# Patient Record
Sex: Female | Born: 1995 | Race: Black or African American | Hispanic: No | Marital: Single | Smoking: Never smoker
Health system: Southern US, Community
[De-identification: ages and names within clinical notes are randomized; demographics above are authoritative.]

---
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
## 2013-03-20 ENCOUNTER — Emergency Department (HOSPITAL_COMMUNITY)
Admission: EM | Admit: 2013-03-20 | Discharge: 2013-03-20 | Disposition: A | Discharge status: Home / Self Care | Payer: BC Managed Care – PPO | Attending: Emergency Medicine | Admitting: Emergency Medicine

## 2013-03-20 ENCOUNTER — Encounter (HOSPITAL_COMMUNITY): Payer: Self-pay | Admitting: Emergency Medicine

## 2013-03-20 DIAGNOSIS — S60949A Unspecified superficial injury of unspecified finger, initial encounter: Secondary | ICD-10-CM | POA: Insufficient documentation

## 2013-03-20 DIAGNOSIS — Y9389 Activity, other specified: Secondary | ICD-10-CM | POA: Insufficient documentation

## 2013-03-20 DIAGNOSIS — R609 Edema, unspecified: Secondary | ICD-10-CM | POA: Insufficient documentation

## 2013-03-20 DIAGNOSIS — W4909XA Other specified item causing external constriction, initial encounter: Secondary | ICD-10-CM | POA: Insufficient documentation

## 2013-03-20 DIAGNOSIS — Y929 Unspecified place or not applicable: Secondary | ICD-10-CM | POA: Insufficient documentation

## 2013-03-20 DIAGNOSIS — S60449A External constriction of unspecified finger, initial encounter: Secondary | ICD-10-CM

## 2013-03-20 DIAGNOSIS — W4904XA Ring or other jewelry causing external constriction, initial encounter: Secondary | ICD-10-CM

## 2013-03-20 NOTE — ED Notes (Signed)
PT ambulated with baseline gait; VSS; A&Ox3; no signs of distress; respirations even and unlabored; skin warm and dry; no questions upon discharge.  

## 2013-03-20 NOTE — ED Notes (Signed)
RN at bedside with PA assisting with ring removal. 2 rings were cut with pt's permission. Finger intact and warm with distal pulses.

## 2013-03-20 NOTE — ED Notes (Signed)
Pt hand placed in bucket of ice water.

## 2013-03-20 NOTE — ED Notes (Signed)
Pt reports that she placed a ring in her left middle finger last night and has been unable to get it off. Knuckle is swollen above the ring.

## 2013-03-20 NOTE — ED Provider Notes (Signed)
CSN: [id number]     Arrival date & time 03/20/13  1749 History  This chart was scribed for non-physician practitioner Junius FinnerErin O'Malley, PA-C working with Laray AngerKathleen M McManus, DO by Donne Anonayla Curran, ED Scribe. This patient was seen in room TR06C/TR06C and the patient's care was started at 1838.    First MD Initiated Contact with Patient 03/20/13 1838     Chief Complaint  Patient presents with  . ring stuck on middle finger     The history is provided by the patient. No language interpreter was used.   HPI Comments: Kelly Tate is a 18 y.o. female who presents to the Emergency Department complaining of a ring stuck on her left middle finger. She states she put 3 rings on her finger last night and has been unable to remove two of the three rings since. She reports associated joint swelling around the knuckle. She tried looping floss through the rings to remove them but was unsuccessful. She denies any other pain at this time. She denies any allergies to any medication.   History reviewed. No pertinent past medical history. History reviewed. No pertinent past surgical history. History reviewed. No pertinent family history. History  Substance Use Topics  . Smoking status: Never Smoker   . Smokeless tobacco: Not on file  . Alcohol Use: No   OB History   Grav Para Term Preterm Abortions TAB SAB Ect Mult Living                 Review of Systems  Musculoskeletal: Positive for joint swelling.  All other systems reviewed and are negative.    Allergies  Review of patient's allergies indicates no known allergies.  Home Medications  No current outpatient prescriptions on file.  BP 106/72  Pulse 87  Temp(Src) 97.7 F (36.5 C) (Oral)  Resp 19  SpO2 96%  Physical Exam  Nursing note and vitals reviewed. Constitutional: She is oriented to person, place, and time. She appears well-developed and well-nourished.  HENT:  Head: Normocephalic and atraumatic.  Eyes: EOM are normal.  Neck:  Normal range of motion.  Cardiovascular: Normal rate.   Capillary refill < 3 seconds  Pulmonary/Chest: Effort normal.  Musculoskeletal: Normal range of motion.  Mild edema of left middle finger, erythema. FROM left middle finger. 2 rings metal rings in place. Skin in tact.   Neurological: She is alert and oriented to person, place, and time.  Skin: Skin is warm and dry.  Psychiatric: She has a normal mood and affect. Her behavior is normal.    ED Course  Procedures (including critical care time) DIAGNOSTIC STUDIES: Oxygen Saturation is 99% on RA, normal by my interpretation.    COORDINATION OF CARE: 6:43 PM Discussed treatment plan which includes ring removal with pt at bedside and pt agreed to plan.   6:50 PM Two rings in place on proximal phalanx of left middle finger. Mild edema of PIP.  Ring remover used to remove rings. No immediate complications. Pt tolerated procedure w/o difficulty.     Labs Review Labs Reviewed - No data to display Imaging Review No results found.  EKG Interpretation   None       MDM   1. Constrictive jewelry of finger     I personally performed the services described in this documentation, which was scribed in my presence. The recorded information has been reviewed and is accurate.    Junius Finnerrin O'Malley, PA-C 03/20/13 2307

## 2013-03-21 NOTE — ED Provider Notes (Signed)
Medical screening examination/treatment/procedure(s) were performed by non-physician practitioner and as supervising physician I was immediately available for consultation/collaboration.  EKG Interpretation   None         Prashant Glosser M Halston Fairclough, DO 03/21/13 1031 

## 2015-02-15 DIAGNOSIS — N83201 Unspecified ovarian cyst, right side: Secondary | ICD-10-CM | POA: Insufficient documentation

## 2015-02-21 ENCOUNTER — Other Ambulatory Visit: Payer: Self-pay | Admitting: Obstetrics and Gynecology

## 2015-02-22 ENCOUNTER — Other Ambulatory Visit (HOSPITAL_COMMUNITY): Payer: Self-pay | Admitting: Obstetrics and Gynecology

## 2015-02-22 ENCOUNTER — Encounter (HOSPITAL_COMMUNITY): Payer: Self-pay | Admitting: *Deleted

## 2015-02-22 NOTE — H&P (Signed)
Kelly Tate is a 19 y.o. female G0  presents for laparoscopic ovarian cystectomy because of increasing pelvic pain and enlarging right ovarian cyst. In October 2016 the patient presented with right sided pelvic pain of one month duration.  It was described as constant and crampy, made worse by standing,  intercourse in certain positions and when she's become hungry.  She found relief with Naproxen though a pelvic ultrasound  (01/05/15) showed:  uterus: 7.13 x 3.58 x 4.62 cm, endometrium: 1.10 cm;  left ovary: 4.75 x 2.12 x 3.11 cm with a small complex ? corpus luteum cyst 1.6 x 1.6 x 1.3 cm;  right ovary: 7.92 x 8.46 x 5.81 cm and a complex ? hemorrhagic cyst 5.7 x 7.2 x 7.6 cm with normal venous-arterial waveforms;  free fluid was noted in both adnexae and posterior cul-de-sac. As time went on the patient managed with Naproxen for pain and did not have any bowel  or bladder impairment.  Eventually, however,  she developed urinary frequency, problems starting her urine flow and increased pain that  was not as well controlled with Naproxen.  A repeat ultrasound, 02/15/15 showed a resolved left ovarian cyst but an enlarged right cyst measuring: 8.1 x 6.4 x 7.3 cm.  Given her increasing symptoms and the size of the right ovarian cyst,  the patient has decided to proceed with removal of the ovarian cyst.   Past Medical History  OB History:G: 0  GYN History: menarche: 19 YO    LMP: 02/09/2015    Contracepton: Condoms;   No history of STDs or abnormal PAP smear;  Last PAP smear: 12/2014 showed ASCUS with negative HPV  Medical History: Negative  Surgical History: Negative Denies history of blood transfusions  Family History: Oral  Cancer, Heart Disease and Hypertension  Social History: Single and employed at [employer];  Denies tobacco use and occasionally uses alcohol   Medications:  Naproxen 550 mg bid pc prn Hydrocortisone Cream 1%  Topically prn  No Known Allergies   Denies  sensitivity to peanuts, shellfish, soy, latex or adhesives.   ROS: Admits to pelvic pain, urinary frequency/hesitancy but   denies corrective lenses, headache, vision changes, nasal congestion, dysphagia, tinnitus, dizziness, hoarseness, cough,  chest pain, shortness of breath, nausea, vomiting, diarrhea,constipation,  urinary urgency  dysuria, hematuria, vaginitis symptoms, swelling of joints,easy bruising,  myalgias, arthralgias, skin rashes, unexplained weight loss and except as is mentioned in the history of present illness, patient's review of systems is otherwise negative.   Physical Exam  Bp: 90/60     P: 80 bpm   R: 20   Temperature: 98 degrees F orally  Weight: 135lbs.  Height: 5'9"  BMI: 19.9  Neck: supple without masses or thyromegaly Lungs: clear to auscultation Heart: regular rate and rhythm Abdomen: soft, tenderness in both lower quadrants with voluntary guarding but no rebound and no organomegaly Pelvic:EGBUS- wnl; vagina-normal rugae; uterus-normal size, cervix without lesions or motion tenderness; adnexae-bilateral tenderness or masses Extremities:  no clubbing, cyanosis or edema   Assesment: Large Complex Right Ovarian Cyst           Pelvic Pain   Disposition:  Reviewed the risks of surgery to include, but not limited to: reaction to anesthesia, damage to adjacent organs, infection, excessive bleeding, removal of ovarian tissue and possible open abdominal incision. The patient verbalized understanding of these risks and has consented to [facility] March 01, 2015 at 2  p.m.  CSN# [id number]   Maurion Walkowiak J. Lowell Guitar, PA-C  for Dr. Pierre Bali. Dillard

## 2015-03-01 ENCOUNTER — Ambulatory Visit (HOSPITAL_COMMUNITY): Payer: BC Managed Care – PPO | Admitting: Anesthesiology

## 2015-03-01 ENCOUNTER — Ambulatory Visit (HOSPITAL_COMMUNITY)
Admission: RE | Admit: 2015-03-01 | Discharge: 2015-03-01 | Disposition: A | Discharge status: Home / Self Care | Payer: BC Managed Care – PPO | Source: Ambulatory Visit | Attending: Obstetrics and Gynecology | Admitting: Obstetrics and Gynecology

## 2015-03-01 ENCOUNTER — Encounter (HOSPITAL_COMMUNITY): Payer: Self-pay

## 2015-03-01 ENCOUNTER — Encounter (HOSPITAL_COMMUNITY)
Admission: RE | Disposition: A | Discharge status: Home / Self Care | Payer: Self-pay | Source: Ambulatory Visit | Attending: Obstetrics and Gynecology

## 2015-03-01 DIAGNOSIS — N80129 Deep endometriosis of ovary, unspecified ovary: Secondary | ICD-10-CM

## 2015-03-01 DIAGNOSIS — N801 Endometriosis of ovary: Secondary | ICD-10-CM

## 2015-03-01 DIAGNOSIS — N83201 Unspecified ovarian cyst, right side: Secondary | ICD-10-CM | POA: Insufficient documentation

## 2015-03-01 DIAGNOSIS — M25559 Pain in unspecified hip: Secondary | ICD-10-CM

## 2015-03-01 DIAGNOSIS — R102 Pelvic and perineal pain: Secondary | ICD-10-CM | POA: Insufficient documentation

## 2015-03-01 HISTORY — PX: LAPAROSCOPIC OVARIAN CYSTECTOMY: SHX6248

## 2015-03-01 LAB — CBC
HCT: 36.7 % (ref 36.0–46.0)
Hemoglobin: 11.8 g/dL — ABNORMAL LOW (ref 12.0–15.0)
MCH: 27.6 pg (ref 26.0–34.0)
MCHC: 32.2 g/dL (ref 30.0–36.0)
MCV: 85.7 fL (ref 78.0–100.0)
PLATELETS: 272 10*3/uL (ref 150–400)
RBC: 4.28 MIL/uL (ref 3.87–5.11)
RDW: 14.3 % (ref 11.5–15.5)
WBC: 7.6 10*3/uL (ref 4.0–10.5)

## 2015-03-01 LAB — PREGNANCY, URINE: PREG TEST UR: NEGATIVE

## 2015-03-01 SURGERY — EXCISION, CYST, OVARY, LAPAROSCOPIC
Anesthesia: General | Laterality: Right

## 2015-03-01 MED ORDER — PROPOFOL 10 MG/ML IV BOLUS
INTRAVENOUS | Status: DC | PRN
  Administered 2015-03-01: 160 mg via INTRAVENOUS

## 2015-03-01 MED ORDER — LACTATED RINGERS IV SOLN
INTRAVENOUS | Status: DC
  Administered 2015-03-01 (×2): via INTRAVENOUS

## 2015-03-01 MED ORDER — GLYCOPYRROLATE 0.2 MG/ML IJ SOLN
INTRAMUSCULAR | Status: DC | PRN
  Administered 2015-03-01: .5 mg via INTRAVENOUS
  Administered 2015-03-01: 0.1 mg via INTRAVENOUS

## 2015-03-01 MED ORDER — GLYCOPYRROLATE 0.2 MG/ML IJ SOLN
INTRAMUSCULAR | Status: AC
  Filled 2015-03-01: qty 1

## 2015-03-01 MED ORDER — ACETAMINOPHEN 160 MG/5ML PO SOLN: 325.0000 mg | ORAL | Status: DC | PRN

## 2015-03-01 MED ORDER — NEOSTIGMINE METHYLSULFATE 10 MG/10ML IV SOLN
INTRAVENOUS | Status: AC
  Filled 2015-03-01: qty 1

## 2015-03-01 MED ORDER — SCOPOLAMINE 1 MG/3DAYS TD PT72
MEDICATED_PATCH | TRANSDERMAL | Status: AC
  Administered 2015-03-01: 1.5 mg via TRANSDERMAL
  Filled 2015-03-01: qty 1

## 2015-03-01 MED ORDER — LIDOCAINE HCL (CARDIAC) 20 MG/ML IV SOLN
INTRAVENOUS | Status: DC | PRN
  Administered 2015-03-01: 80 mg via INTRAVENOUS

## 2015-03-01 MED ORDER — ONDANSETRON HCL 4 MG/2ML IJ SOLN
INTRAMUSCULAR | Status: AC
  Filled 2015-03-01: qty 2

## 2015-03-01 MED ORDER — FENTANYL CITRATE (PF) 100 MCG/2ML IJ SOLN
INTRAMUSCULAR | Status: DC | PRN
  Administered 2015-03-01 (×3): 50 ug via INTRAVENOUS
  Administered 2015-03-01: 100 ug via INTRAVENOUS

## 2015-03-01 MED ORDER — MEPERIDINE HCL 25 MG/ML IJ SOLN: 6.2500 mg | INTRAMUSCULAR | Status: DC | PRN

## 2015-03-01 MED ORDER — FENTANYL CITRATE (PF) 100 MCG/2ML IJ SOLN
INTRAMUSCULAR | Status: AC
  Filled 2015-03-01: qty 2

## 2015-03-01 MED ORDER — HYDROCODONE-ACETAMINOPHEN 5-325 MG PO TABS: ORAL_TABLET | ORAL | Status: DC

## 2015-03-01 MED ORDER — LIDOCAINE HCL (CARDIAC) 20 MG/ML IV SOLN
INTRAVENOUS | Status: AC
  Filled 2015-03-01: qty 5

## 2015-03-01 MED ORDER — SCOPOLAMINE 1 MG/3DAYS TD PT72
1.0000 | MEDICATED_PATCH | Freq: Once | TRANSDERMAL | Status: DC
  Administered 2015-03-01: 1.5 mg via TRANSDERMAL

## 2015-03-01 MED ORDER — BUPIVACAINE-EPINEPHRINE (PF) 0.25% -1:200000 IJ SOLN
INTRAMUSCULAR | Status: AC
  Filled 2015-03-01: qty 30

## 2015-03-01 MED ORDER — FENTANYL CITRATE (PF) 100 MCG/2ML IJ SOLN
25.0000 ug | INTRAMUSCULAR | Status: DC | PRN
  Administered 2015-03-01 (×2): 50 ug via INTRAVENOUS

## 2015-03-01 MED ORDER — ACETAMINOPHEN 325 MG PO TABS: 325.0000 mg | ORAL_TABLET | ORAL | Status: DC | PRN

## 2015-03-01 MED ORDER — MIDAZOLAM HCL 2 MG/2ML IJ SOLN
INTRAMUSCULAR | Status: DC | PRN
  Administered 2015-03-01: 2 mg via INTRAVENOUS

## 2015-03-01 MED ORDER — DEXAMETHASONE SODIUM PHOSPHATE 10 MG/ML IJ SOLN
INTRAMUSCULAR | Status: DC | PRN
  Administered 2015-03-01: 4 mg via INTRAVENOUS

## 2015-03-01 MED ORDER — ONDANSETRON HCL 4 MG/2ML IJ SOLN: 4.0000 mg | Freq: Once | INTRAMUSCULAR | Status: DC | PRN

## 2015-03-01 MED ORDER — ONDANSETRON HCL 4 MG/2ML IJ SOLN
INTRAMUSCULAR | Status: DC | PRN
  Administered 2015-03-01: 4 mg via INTRAVENOUS

## 2015-03-01 MED ORDER — OXYCODONE HCL 5 MG PO TABS
5.0000 mg | ORAL_TABLET | Freq: Once | ORAL | Status: DC | PRN
Start: 2015-03-01 — End: 2015-03-01

## 2015-03-01 MED ORDER — FENTANYL CITRATE (PF) 250 MCG/5ML IJ SOLN
INTRAMUSCULAR | Status: AC
  Filled 2015-03-01: qty 5

## 2015-03-01 MED ORDER — LACTATED RINGERS IR SOLN
Status: DC | PRN
  Administered 2015-03-01: 3000 mL

## 2015-03-01 MED ORDER — ROCURONIUM BROMIDE 100 MG/10ML IV SOLN
INTRAVENOUS | Status: AC
  Filled 2015-03-01: qty 1

## 2015-03-01 MED ORDER — NAPROXEN SODIUM 550 MG PO TABS: ORAL_TABLET | ORAL | Status: DC

## 2015-03-01 MED ORDER — KETOROLAC TROMETHAMINE 30 MG/ML IJ SOLN: 30.0000 mg | Freq: Once | INTRAMUSCULAR | Status: DC | PRN

## 2015-03-01 MED ORDER — PROPOFOL 10 MG/ML IV BOLUS
INTRAVENOUS | Status: AC
  Filled 2015-03-01: qty 20

## 2015-03-01 MED ORDER — DEXAMETHASONE SODIUM PHOSPHATE 4 MG/ML IJ SOLN
INTRAMUSCULAR | Status: AC
  Filled 2015-03-01: qty 1

## 2015-03-01 MED ORDER — ROCURONIUM BROMIDE 100 MG/10ML IV SOLN
INTRAVENOUS | Status: DC | PRN
  Administered 2015-03-01: 5 mg via INTRAVENOUS
  Administered 2015-03-01: 10 mg via INTRAVENOUS
  Administered 2015-03-01: 40 mg via INTRAVENOUS

## 2015-03-01 MED ORDER — GLYCOPYRROLATE 0.2 MG/ML IJ SOLN
INTRAMUSCULAR | Status: AC
  Filled 2015-03-01: qty 2

## 2015-03-01 MED ORDER — KETOROLAC TROMETHAMINE 30 MG/ML IJ SOLN
INTRAMUSCULAR | Status: AC
  Filled 2015-03-01: qty 1

## 2015-03-01 MED ORDER — OXYCODONE HCL 5 MG/5ML PO SOLN: 5.0000 mg | Freq: Once | ORAL | Status: DC | PRN

## 2015-03-01 MED ORDER — MIDAZOLAM HCL 2 MG/2ML IJ SOLN
INTRAMUSCULAR | Status: AC
  Filled 2015-03-01: qty 2

## 2015-03-01 MED ORDER — KETOROLAC TROMETHAMINE 30 MG/ML IJ SOLN
INTRAMUSCULAR | Status: DC | PRN
  Administered 2015-03-01: 30 mg via INTRAVENOUS

## 2015-03-01 MED ORDER — NEOSTIGMINE METHYLSULFATE 10 MG/10ML IV SOLN
INTRAVENOUS | Status: DC | PRN
  Administered 2015-03-01: 3 mg via INTRAVENOUS

## 2015-03-01 MED ORDER — BUPIVACAINE-EPINEPHRINE 0.25% -1:200000 IJ SOLN
INTRAMUSCULAR | Status: DC | PRN
  Administered 2015-03-01: 11 mL

## 2015-03-01 SURGICAL SUPPLY — 34 items
BARRIER ADHS 3X4 INTERCEED (GAUZE/BANDAGES/DRESSINGS) IMPLANT
CABLE HIGH FREQUENCY MONO STRZ (ELECTRODE) IMPLANT
CHLORAPREP W/TINT 26ML (MISCELLANEOUS) ×3 IMPLANT
CLOTH BEACON ORANGE TIMEOUT ST (SAFETY) ×3 IMPLANT
DRSG COVADERM PLUS 2X2 (GAUZE/BANDAGES/DRESSINGS) ×6 IMPLANT
DRSG OPSITE POSTOP 3X4 (GAUZE/BANDAGES/DRESSINGS) ×3 IMPLANT
DRSG TELFA 3X8 NADH (GAUZE/BANDAGES/DRESSINGS) ×3 IMPLANT
DRSG VASELINE 3X18 (GAUZE/BANDAGES/DRESSINGS) IMPLANT
FORCEPS CUTTING 33CM 5MM (CUTTING FORCEPS) ×3 IMPLANT
FORCEPS CUTTING 45CM 5MM (CUTTING FORCEPS) IMPLANT
GLOVE BIO SURGEON STRL SZ 6.5 (GLOVE) ×2 IMPLANT
GLOVE BIO SURGEONS STRL SZ 6.5 (GLOVE) ×1
GLOVE BIOGEL PI IND STRL 7.0 (GLOVE) ×3 IMPLANT
GLOVE BIOGEL PI INDICATOR 7.0 (GLOVE) ×6
GOWN STRL REUS W/TWL LRG LVL3 (GOWN DISPOSABLE) ×6 IMPLANT
LIQUID BAND (GAUZE/BANDAGES/DRESSINGS) ×3 IMPLANT
MANIPULATOR UTERINE 4.5 ZUMI (MISCELLANEOUS) IMPLANT
NS IRRIG 1000ML POUR BTL (IV SOLUTION) ×3 IMPLANT
PACK LAPAROSCOPY BASIN (CUSTOM PROCEDURE TRAY) ×3 IMPLANT
PAD POSITIONING PINK XL (MISCELLANEOUS) ×3 IMPLANT
POUCH SPECIMEN RETRIEVAL 10MM (ENDOMECHANICALS) ×3 IMPLANT
SET IRRIG TUBING LAPAROSCOPIC (IRRIGATION / IRRIGATOR) ×3 IMPLANT
SHEARS HARMONIC ACE PLUS 36CM (ENDOMECHANICALS) IMPLANT
SOLUTION ELECTROLUBE (MISCELLANEOUS) IMPLANT
SPONGE SURGIFOAM ABS GEL 12-7 (HEMOSTASIS) ×3 IMPLANT
SUT MNCRL AB 3-0 PS2 27 (SUTURE) ×3 IMPLANT
SUT VICRYL 0 ENDOLOOP (SUTURE) IMPLANT
SUT VICRYL 0 UR6 27IN ABS (SUTURE) ×6 IMPLANT
TOWEL OR 17X24 6PK STRL BLUE (TOWEL DISPOSABLE) ×6 IMPLANT
TRAY FOLEY CATH SILVER 14FR (SET/KITS/TRAYS/PACK) ×3 IMPLANT
TROCAR BALLN 12MMX100 BLUNT (TROCAR) ×3 IMPLANT
TROCAR XCEL NON-BLD 5MMX100MML (ENDOMECHANICALS) ×6 IMPLANT
WARMER LAPAROSCOPE (MISCELLANEOUS) ×3 IMPLANT
WATER STERILE IRR 1000ML POUR (IV SOLUTION) IMPLANT

## 2015-03-01 NOTE — Anesthesia Procedure Notes (Signed)
Procedure Name: Intubation Date/Time: 03/01/2015 2:10 PM Performed by: Yolonda KidaARVER, Celesta Funderburk L Pre-anesthesia Checklist: Patient identified, Emergency Drugs available, Suction available and Patient being monitored Patient Re-evaluated:Patient Re-evaluated prior to inductionOxygen Delivery Method: Circle system utilized Preoxygenation: Pre-oxygenation with 100% oxygen Intubation Type: IV induction Ventilation: Mask ventilation without difficulty Laryngoscope Size: Mac and 3 Grade View: Grade I Tube type: Oral Tube size: 7.0 mm Number of attempts: 1 Airway Equipment and Method: Stylet Placement Confirmation: ETT inserted through vocal cords under direct vision,  positive ETCO2,  CO2 detector and breath sounds checked- equal and bilateral Secured at: 22 cm Tube secured with: Tape Dental Injury: Teeth and Oropharynx as per pre-operative assessment

## 2015-03-01 NOTE — Discharge Instructions (Addendum)
Call [facility] @ [phone] if:  You have a temperature greater than or equal to 100.4 degrees Farenheit orally You have pain that is not made better by the pain medication given and taken as directed You have excessive bleeding or problems urinating  Take Colace (Docusate Sodium/Stool Softener) 100 mg 2-3 times daily while taking narcotic pain medicine to avoid constipation or until bowel movements are regular. Take Naproxen 550 mg with food twice a day for 5 days then as needed for pain   You may drive after 48 hours You may walk up steps  You may shower tomorrow You may resume a regular diet  Keep incisions clean and dry Do not lift over 15 pounds for 6 weeks Avoid anything in vagina until after your post-operative visit Ovarian Cyst An ovarian cyst is a fluid-filled sac that forms on an ovary. The ovaries are small organs that produce eggs in women. Various types of cysts can form on the ovaries. Most are not cancerous. Many do not cause problems, and they often go away on their own. Some may cause symptoms and require treatment. Common types of ovarian cysts include:  Functional cysts--These cysts may occur every month during the menstrual cycle. This is normal. The cysts usually go away with the next menstrual cycle if the woman does not get pregnant. Usually, there are no symptoms with a functional cyst.  Endometrioma cysts--These cysts form from the tissue that lines the uterus. They are also called "chocolate cysts" because they become filled with blood that turns brown. This type of cyst can cause pain in the lower abdomen during intercourse and with your menstrual period.  Cystadenoma cysts--This type develops from the cells on the outside of the ovary. These cysts can get very big and cause lower abdomen pain and pain with intercourse. This type of cyst can twist on itself, cut off its blood supply, and cause severe pain. It can also easily rupture and cause a  lot of pain.  Dermoid cysts--This type of cyst is sometimes found in both ovaries. These cysts may contain different kinds of body tissue, such as skin, teeth, hair, or cartilage. They usually do not cause symptoms unless they get very big.  Theca lutein cysts--These cysts occur when too much of a certain hormone (human chorionic gonadotropin) is produced and overstimulates the ovaries to produce an egg. This is most common after procedures used to assist with the conception of a baby (in vitro fertilization). CAUSES   Fertility drugs can cause a condition in which multiple large cysts are formed on the ovaries. This is called ovarian hyperstimulation syndrome.  A condition called polycystic ovary syndrome can cause hormonal imbalances that can lead to nonfunctional ovarian cysts. SIGNS AND SYMPTOMS  Many ovarian cysts do not cause symptoms. If symptoms are present, they may include:  Pelvic pain or pressure.  Pain in the lower abdomen.  Pain during sexual intercourse.  Increasing girth (swelling) of the abdomen.  Abnormal menstrual periods.  Increasing pain with menstrual periods.  Stopping having menstrual periods without being pregnant. DIAGNOSIS  These cysts are commonly found during a routine or annual pelvic exam. Tests may be ordered to find out more about the cyst. These tests may include:  Ultrasound.  X-ray of the pelvis.  CT scan.  MRI.  Blood tests. TREATMENT  Many ovarian cysts go away on their own without treatment. Your health care provider may want to check your cyst regularly for 2-3 months to see if  it changes. For women in menopause, it is particularly important to monitor a cyst closely because of the higher rate of ovarian cancer in menopausal women. When treatment is needed, it may include any of the following:  A procedure to drain the cyst (aspiration). This may be done using a long needle and ultrasound. It can also be done through a laparoscopic  procedure. This involves using a thin, lighted tube with a tiny camera on the end (laparoscope) inserted through a small incision.  Surgery to remove the whole cyst. This may be done using laparoscopic surgery or an open surgery involving a larger incision in the lower abdomen.  Hormone treatment or birth control pills. These methods are sometimes used to help dissolve a cyst. HOME CARE INSTRUCTIONS   Only take over-the-counter or prescription medicines as directed by your health care provider.  Follow up with your health care provider as directed.  Get regular pelvic exams and Pap tests. SEEK MEDICAL CARE IF:   Your periods are late, irregular, or painful, or they stop.  Your pelvic pain or abdominal pain does not go away.  Your abdomen becomes larger or swollen.  You have pressure on your bladder or trouble emptying your bladder completely.  You have pain during sexual intercourse.  You have feelings of fullness, pressure, or discomfort in your stomach.  You lose weight for no apparent reason.  You feel generally ill.  You become constipated.  You lose your appetite.  You develop acne.  You have an increase in body and facial hair.  You are gaining weight, without changing your exercise and eating habits.  You think you are pregnant. SEEK IMMEDIATE MEDICAL CARE IF:   You have increasing abdominal pain.  You feel sick to your stomach (nauseous), and you throw up (vomit).  You develop a fever that comes on suddenly.  You have abdominal pain during a bowel movement.  Your menstrual periods become heavier than usual. MAKE SURE YOU:  Understand these instructions.  Will watch your condition.  Will get help right away if you are not doing well or get worse.   This information is not intended to replace advice given to you by your health care provider. Make sure you discuss any questions you have with your health care provider.   Document Released: 02/17/2005  Document Revised: 02/22/2013 Document Reviewed: 10/25/2012 Elsevier Interactive Patient Education 2016 [employer] DISCHARGE INSTRUCTIONS: Laparoscopy  The following instructions have been prepared to help you care for yourself upon your return home today.  Wound care:  Do not get the incision wet for the first 24 hours. The incision should be kept clean and dry.  The Band-Aids or dressings may be removed the day after surgery.  Should the incision become sore, red, and swollen after the first week, check with your doctor.  Personal hygiene:  Shower the day after your procedure.  Activity and limitations:  Do NOT drive or operate any equipment today.  Do NOT lift anything more than 15 pounds for 2-3 weeks after surgery.  Do NOT rest in bed all day.  Walking is encouraged. Walk each day, starting slowly with 5-minute walks 3 or 4 times a day. Slowly increase the length of your walks.  Walk up and down stairs slowly.  Do NOT do strenuous activities, such as golfing, playing tennis, bowling, running, biking, weight lifting, gardening, mowing, or vacuuming for 2-4 weeks. Ask your doctor when it is okay to start.  Diet: Eat a light meal  as desired this evening. You may resume your usual diet tomorrow.  Return to work: This is dependent on the type of work you do. For the most part you can return to a desk job within a week of surgery. If you are more active at work, please discuss this with your doctor.  What to expect after your surgery: You may have a slight burning sensation when you urinate on the first day. You may have a very small amount of blood in the urine. Expect to have a small amount of vaginal discharge/light bleeding for 1-2 weeks. It is not unusual to have abdominal soreness and bruising for up to 2 weeks. You may be tired and need more rest for about 1 week. You may experience shoulder pain for 24-72 hours. Lying flat in bed may relieve it.  Call your doctor for  any of the following:  Develop a fever of 100.4 or greater  Inability to urinate 6 hours after discharge from hospital  Severe pain not relieved by pain medications  Persistent of heavy bleeding at incision site  Redness or swelling around incision site after a week  Increasing nausea or vomiting  Patient Signature________________________________________ Nurse Signature_________________________________________ Post Anesthesia Home Care Instructions  Activity: Get plenty of rest for the remainder of the day. A responsible adult should stay with you for 24 hours following the procedure.  For the next 24 hours, DO NOT: -Drive a car -Advertising copywriter -Drink alcoholic beverages -Take any medication unless instructed by your physician -Make any legal decisions or sign important papers.  Meals: Start with liquid foods such as gelatin or soup. Progress to regular foods as tolerated. Avoid greasy, spicy, heavy foods. If nausea and/or vomiting occur, drink only clear liquids until the nausea and/or vomiting subsides. Call your physician if vomiting continues.  Special Instructions/Symptoms: Your throat may feel dry or sore from the anesthesia or the breathing tube placed in your throat during surgery. If this causes discomfort, gargle with warm salt water. The discomfort should disappear within 24 hours.  If you had a scopolamine patch placed behind your ear for the management of post- operative nausea and/or vomiting:  1. The medication in the patch is effective for 72 hours, after which it should be removed.  Wrap patch in a tissue and discard in the trash. Wash hands thoroughly with soap and water. 2. You may remove the patch earlier than 72 hours if you experience unpleasant side effects which may include dry mouth, dizziness or visual disturbances. 3. Avoid touching the patch. Wash your hands with soap and water after contact with the patch.

## 2015-03-01 NOTE — Op Note (Signed)
Diagnostic Laparoscopy Procedure Note  Indications: The patient is a 19 y.o. female with large right ovarian cyst.  Pre-operative Diagnosis: pelvic pain with right ovarian cyst  Post-operative Diagnosis: same  Surgeon: ZOXWRUE,AVWUJILLARD,Macarthur Lorusso A   Assistants: Henreitta LeberElmira Powell PA  Anesthesia: General endotracheal anesthesia  ASA Class: per anesthesia  Procedure Details  The patient was seen in the Holding Room. The risks, benefits, complications, treatment options, and expected outcomes were discussed with the patient. The possibilities of reaction to medication, pulmonary aspiration, perforation of viscus, bleeding, recurrent infection, the need for additional procedures, failure to diagnose a condition, and creating a complication requiring transfusion or operation were discussed with the patient. The patient concurred with the proposed plan, giving informed consent. The patient was taken to the Operating Room, identified as Psychologist, educationalArnetta Ewart and the procedure verified as Diagnostic Laparoscopy. A Time Out was held and the above information confirmed.  After induction of general anesthesia, the patient was placed in modified dorsal lithotomy position where she was prepped, draped, and catheterized in the normal, sterile fashion.  The cervix was visualized and an intrauterine manipulator was placed. A 2 cm umbilical incision was then performed. The incision was carried down to the fascia. The fascia and peritoneum was entered.  o vicryl was placed around the fascia.  The hassan was placed.  The above findings were noted. Large 12 cm right ovariancyst seen.  It was filled with chocolate colored fluid c/w endometrioma.  Left ovary appeared normal.    T  Using a needle and suction the cyst was punctured.  The fluid was drained.  Using the gyrus and traction with counter traction the cyst wall was removed.  The access ovary was removed.  Hemostasis was assured.  All instruents were removed.  The umbilical  fascia was reapproximated by tying the suture.  The skin incision was closed in a subcuticlar fashion.  Remainder on incisions close with dermabond.    The intrauterine manipulator was then removed.  Instrument, sponge, and needle counts were correct prior to abdominal closure and at the conclusion of the case.   Findings: The anterior cul-de-sac and round ligaments wnl The uterus WNL The adnexa SEE ABOVE Cul-de-sac SOME POWDER BURN LESIONS SEEN   Estimated Blood Loss:  Minimal         Drains: NONE         Total IV Fluids: 1500mL         Specimens: CYST WALL AND PORTION OF OVARY              Complications:  None; patient tolerated the procedure well.         Disposition: PACU - hemodynamically stable.         Condition: stable

## 2015-03-01 NOTE — Anesthesia Preprocedure Evaluation (Signed)
Anesthesia Evaluation  Patient identified by MRN, date of birth, ID band Patient awake    Reviewed: Allergy & Precautions, H&P , NPO status , Patient's Chart, lab work & pertinent test results  Airway Mallampati: I  TM Distance: >3 FB Neck ROM: full    Dental  (+) Teeth Intact   Pulmonary neg pulmonary ROS,    Pulmonary exam normal        Cardiovascular negative cardio ROS Normal cardiovascular exam     Neuro/Psych negative neurological ROS  negative psych ROS   GI/Hepatic negative GI ROS, Neg liver ROS,   Endo/Other  negative endocrine ROS  Renal/GU negative Renal ROS     Musculoskeletal   Abdominal Normal abdominal exam  (+)   Peds  Hematology negative hematology ROS (+)   Anesthesia Other Findings   Reproductive/Obstetrics negative OB ROS                             Anesthesia Physical Anesthesia Plan  ASA: I  Anesthesia Plan: General   Post-op Pain Management:    Induction: Intravenous  Airway Management Planned: Oral ETT  Additional Equipment:   Intra-op Plan:   Post-operative Plan: Extubation in OR  Informed Consent: I have reviewed the patients History and Physical, chart, labs and discussed the procedure including the risks, benefits and alternatives for the proposed anesthesia with the patient or authorized representative who has indicated his/her understanding and acceptance.   Dental Advisory Given  Plan Discussed with: CRNA and Surgeon  Anesthesia Plan Comments:         Anesthesia Quick Evaluation

## 2015-03-01 NOTE — Anesthesia Postprocedure Evaluation (Signed)
Anesthesia Post Note  Patient: Psychologist, [name]  Procedure(s) Performed: Procedure(s) (LRB): LAPAROSCOPIC OVARIAN CYSTECTOMY (Right)  Patient location during evaluation: PACU Anesthesia Type: General Level of consciousness: awake Pain management: pain level controlled Vital Signs Assessment: post-procedure vital signs reviewed and stable Respiratory status: spontaneous breathing Cardiovascular status: stable Postop Assessment: no signs of nausea or vomiting Anesthetic complications: no    Last Vitals:  Filed Vitals:   03/01/15 1630 03/01/15 1645  BP: 112/58 120/88  Pulse: 68 59  Temp: 36.9 C   Resp: 17 14    Last Pain:  Filed Vitals:   03/01/15 1655  PainSc: 6                  Asael Pann JR,JOHN Emmanuela Ghazi

## 2015-03-01 NOTE — Transfer of Care (Signed)
Immediate Anesthesia Transfer of Care Note  Patient: Psychologist, [name]  Procedure(s) Performed: Procedure(s): LAPAROSCOPIC OVARIAN CYSTECTOMY (Right)  Patient Location: PACU  Anesthesia Type:General  Level of Consciousness: awake  Airway & Oxygen Therapy: Patient Spontanous Breathing  Post-op Assessment: Report given to PACU RN  Post vital signs: stable  Filed Vitals:   03/01/15 1219  BP: 109/80  Pulse: 82  Temp: 36.6 C  Resp: 18    Complications: No apparent anesthesia complications

## 2015-03-02 ENCOUNTER — Encounter (HOSPITAL_COMMUNITY): Payer: Self-pay | Admitting: Obstetrics and Gynecology

## 2015-05-17 DIAGNOSIS — N809 Endometriosis, unspecified: Secondary | ICD-10-CM | POA: Insufficient documentation

## 2020-02-02 DIAGNOSIS — N94810 Vulvar vestibulitis: Secondary | ICD-10-CM | POA: Insufficient documentation

## 2020-07-01 ENCOUNTER — Other Ambulatory Visit: Payer: Self-pay

## 2020-07-01 ENCOUNTER — Emergency Department (HOSPITAL_COMMUNITY)
Admission: EM | Admit: 2020-07-01 | Discharge: 2020-07-01 | Disposition: A | Discharge status: Home / Self Care | Payer: 59 | Attending: Emergency Medicine | Admitting: Emergency Medicine

## 2020-07-01 DIAGNOSIS — X088XXA Exposure to other specified smoke, fire and flames, initial encounter: Secondary | ICD-10-CM | POA: Insufficient documentation

## 2020-07-01 DIAGNOSIS — T2601XA Burn of right eyelid and periocular area, initial encounter: Secondary | ICD-10-CM | POA: Diagnosis present

## 2020-07-01 DIAGNOSIS — Y9389 Activity, other specified: Secondary | ICD-10-CM | POA: Diagnosis not present

## 2020-07-01 DIAGNOSIS — T2641XA Burn of right eye and adnexa, part unspecified, initial encounter: Secondary | ICD-10-CM

## 2020-07-01 MED ORDER — ERYTHROMYCIN 5 MG/GM OP OINT: TOPICAL_OINTMENT | OPHTHALMIC | 0 refills | Status: DC

## 2020-07-01 MED ORDER — TETRACAINE HCL 0.5 % OP SOLN
2.0000 [drp] | Freq: Once | OPHTHALMIC | Status: AC
  Administered 2020-07-01: 2 [drp] via OPHTHALMIC
  Filled 2020-07-01: qty 4

## 2020-07-01 MED ORDER — FLUORESCEIN SODIUM 1 MG OP STRP
1.0000 | ORAL_STRIP | Freq: Once | OPHTHALMIC | Status: AC
  Administered 2020-07-01: 1 via OPHTHALMIC
  Filled 2020-07-01: qty 1
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
## 2020-07-01 NOTE — Discharge Instructions (Addendum)
  Apply the erythromycin ointment, as prescribed, 4 times daily for 5 days.  Follow-up with the ophthalmologist in the office.  They expect to see you in the office tomorrow morning at 8:15 AM.
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
## 2020-09-25 DIAGNOSIS — B009 Herpesviral infection, unspecified: Secondary | ICD-10-CM | POA: Insufficient documentation

## 2020-09-25 DIAGNOSIS — M8580 Other specified disorders of bone density and structure, unspecified site: Secondary | ICD-10-CM | POA: Insufficient documentation

## 2021-04-25 ENCOUNTER — Ambulatory Visit
Admission: EM | Admit: 2021-04-25 | Discharge: 2021-04-25 | Disposition: A | Discharge status: Home / Self Care | Payer: 59

## 2021-04-25 ENCOUNTER — Encounter (HOSPITAL_COMMUNITY): Payer: Self-pay | Admitting: Emergency Medicine

## 2021-04-25 ENCOUNTER — Other Ambulatory Visit: Payer: Self-pay

## 2021-04-25 ENCOUNTER — Ambulatory Visit (HOSPITAL_COMMUNITY)
Admission: EM | Admit: 2021-04-25 | Discharge: 2021-04-25 | Disposition: A | Discharge status: Home / Self Care | Payer: 59

## 2021-04-25 ENCOUNTER — Ambulatory Visit: Payer: Self-pay

## 2021-04-25 ENCOUNTER — Ambulatory Visit (INDEPENDENT_AMBULATORY_CARE_PROVIDER_SITE_OTHER): Payer: 59

## 2021-04-25 DIAGNOSIS — M25512 Pain in left shoulder: Secondary | ICD-10-CM

## 2021-04-25 DIAGNOSIS — M25532 Pain in left wrist: Secondary | ICD-10-CM

## 2021-04-25 NOTE — ED Triage Notes (Addendum)
Seen for scapula and left wrist pain and prescribed medicines, but no relief.  Patient saw pcp, thought related to job, so pcp sent patient to workers comp.  Workers comp said patient needs evaluation . Patient has pain left radial aspect of left wrist and left shoulder blade .  Patient is right handed.  Left radial pulse is 2 +

## 2021-04-25 NOTE — ED Triage Notes (Signed)
Pt presents with c/o bilateral hand, arm and shoulder pain. States she thinks the job she does is causing the pain.

## 2021-04-25 NOTE — ED Notes (Signed)
Attempt to call patient, no response. 

## 2021-04-25 NOTE — Telephone Encounter (Signed)
°  Chief Complaint: wrist injury Symptoms: L wrist pain and swelling, unable to grip Frequency: since 03/27/21 Pertinent Negatives: NA Disposition: [] ED /[x] Urgent Care (no appt availability in office) / [] Appointment(In office/virtual)/ []  [facility]/ [] Home Care/ [] Refused Recommended Disposition /[] [facility]/ []  Follow-up with PCP Additional Notes: Pt has no PCP, advised her to go to UC and gave different [facility] Locations for UC. Pt states she will go to UC today to be seen and get second opinion.    Summary: wrist pain and advise   Follow up with pt that injured her wrist at work / pt was seen at [facility] and an office and her employer wouldn't accept the OV notes due to no diagnoses / pt is seeking advice / she is scheduled for a NP appt with [facility] next week / I advised of the ortho office number as well from the portal / Pt works at the [employer] and her wrist and hand gets irritated when being repetitive / please advise      Reason for Disposition  [1] SEVERE pain AND [2] not improved 2 hours after pain medicine/ice packs  Answer Assessment - Initial Assessment Questions 1. MECHANISM: "How did the injury happen?"     Wrist and thumb had sharp pain 2. ONSET: "When did the injury happen?" (Minutes or hours ago)      03/27/21 3. LOCATION: "Where is the injury located?" "Which arm?"     L wrist and thumb 4. APPEARANCE of INJURY: "What does the injury look like?"      Swelling has improved 5. SEVERITY: "Can you use the arm normally?"      Limited activities, unable to fully grip 6. SWELLING or BRUISING: "is there any swelling or bruising?" If Yes, ask: "How large is it? (e.g., inches, centimeters)      slightly 7. PAIN: "Is there pain?" If Yes, ask: "How bad is the pain?"    (Scale 1-10; or mild, moderate, severe)   - NONE (0): no pain.   - MILD (1-3): doesn't interfere with normal activities   - MODERATE (4-7): interferes with normal activities (e.g., work  or school) or awakens from sleep   - SEVERE (8-10): excruciating pain, unable to do any normal activities, unable to hold a cup of water     10 8. TETANUS: For any breaks in the skin, ask: "When was the last tetanus booster?"     NA 9. OTHER SYMPTOMS: "Do you have any other symptoms?"  (e.g., numbness in hand)     No  Protocols used: Arm Injury-A-AH

## 2021-04-25 NOTE — ED Provider Notes (Signed)
[facility]    CSN: [id number] Arrival date & time: 04/25/21  1718    HISTORY   Chief Complaint  Patient presents with   Arm Pain   HPI Kelly Tate is a 26 y.o. female. Pt presents with c/o bilateral hand, arm and shoulder pain. States she thinks the job she does is causing the pain.  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  Patient states that she has noticed that when she is holding her cell phone or doing the dishes, she has numbness and tingling in her thumbs and first few fingers on her left hand, states she started to get the same symptoms on the right.  Patient states she is right-handed.  Patient states he is also noticed that she has numb sensation on the back of her left shoulder, demonstrates by using her right hand to touch this area saying, "I know I am touching it but I can't feel that".    Patient was seen by her primary care provider on February 9 with the same concern.  Per encounter note, her PCP offered physical therapy which patient declined.  PCP recommended she continue NSAIDs and Tylenol as needed.  Also per PCP note, patient advised PCP that she has also been urgent care for the same issue as well, advised PCP that she was provided with a tapering dose of steroids which was also ineffective.  EMR reviewed, I do not see that any imaging has been performed as of yet.  Patient endorses visit to urgent care, states she was given muscle relaxer and tapering dose of steroids and advises me that this was ineffective.  Patient states that her primary care actually referred her to [facility].  I do not see any documentation of this in the PCP note.   The history is provided by the patient.  History reviewed. No pertinent past medical history. There are no problems to display for this patient.  Past Surgical History:  Procedure Laterality Date   LAPAROSCOPIC OVARIAN CYSTECTOMY  Right 03/01/2015   Procedure: LAPAROSCOPIC OVARIAN CYSTECTOMY;  Surgeon: Jaymes Graff, MD;  Location: [facility];  Service: Gynecology;  Laterality: Right;   OB History   No obstetric history on file.    Home Medications    Prior to Admission medications   Not on File    Family History No family history on file. Social History Social History   Tobacco Use   Smoking status: Never  Vaping Use   Vaping Use: Never used  Substance Use Topics   Alcohol use: No   Drug use: Not Currently    Types: Marijuana    Comment: social   Allergies   Patient has no known allergies.  Review of Systems Review of Systems Pertinent findings noted in history of present illness.   Physical Exam Triage Vital Signs ED Triage Vitals  Enc Vitals Group     BP 12/28/20 0827 (!) 147/82     Pulse Rate 12/28/20 0827 72     Resp 12/28/20 0827 18     Temp 12/28/20 0827 98.3 F (36.8 C)     Temp Source 12/28/20 0827 Oral     SpO2 12/28/20 0827 98 %     Weight --      Height --      Head Circumference --      Peak Flow --  Pain Score 12/28/20 0826 5     Pain Loc --      Pain Edu? --      Excl. in GC? --   No data found.  Updated Vital Signs BP 110/74 (BP Location: Right Arm)    Pulse 82    Temp 98.2 F (36.8 C) (Oral)    Resp 18    SpO2 98%   Physical Exam Vitals and nursing note reviewed.  Constitutional:      General: She is not in acute distress.    Appearance: Normal appearance. She is not ill-appearing.  HENT:     Head: Normocephalic and atraumatic.  Eyes:     General: Lids are normal.        Right eye: No discharge.        Left eye: No discharge.     Extraocular Movements: Extraocular movements intact.     Conjunctiva/sclera: Conjunctivae normal.     Right eye: Right conjunctiva is not injected.     Left eye: Left conjunctiva is not injected.  Neck:     Trachea: Trachea and phonation normal.  Cardiovascular:     Rate and Rhythm: Normal rate and regular rhythm.      Pulses: Normal pulses.     Heart sounds: Normal heart sounds. No murmur heard.   No friction rub. No gallop.  Pulmonary:     Effort: Pulmonary effort is normal. No accessory muscle usage, prolonged expiration or respiratory distress.     Breath sounds: Normal breath sounds. No stridor, decreased air movement or transmitted upper airway sounds. No decreased breath sounds, wheezing, rhonchi or rales.  Chest:     Chest wall: No tenderness.  Musculoskeletal:        General: Normal range of motion.     Right shoulder: No swelling, deformity, tenderness, bony tenderness or crepitus. Normal range of motion. Normal strength. Normal pulse.     Left shoulder: Normal. No swelling, deformity, tenderness, bony tenderness or crepitus. Normal range of motion. Normal strength. Normal pulse.     Right wrist: Normal. No swelling, deformity, effusion, tenderness, bony tenderness, snuff box tenderness or crepitus. Normal range of motion.     Left wrist: Tenderness (Positive Lourena SimmondsFinkelstein) present. No swelling, deformity, effusion, bony tenderness, snuff box tenderness or crepitus. Normal range of motion.     Right hand: Normal.     Left hand: Normal.     Cervical back: Normal range of motion and neck supple. Normal range of motion.  Lymphadenopathy:     Cervical: No cervical adenopathy.  Skin:    General: Skin is warm and dry.     Findings: No erythema or rash.  Neurological:     General: No focal deficit present.     Mental Status: She is alert and oriented to person, place, and time.  Psychiatric:        Mood and Affect: Mood normal.        Behavior: Behavior normal.    Visual Acuity Right Eye Distance:   Left Eye Distance:   Bilateral Distance:    Right Eye Near:   Left Eye Near:    Bilateral Near:     UC Couse / Diagnostics / Procedures:    EKG  Radiology DG Wrist Complete Left  Result Date: 04/25/2021 CLINICAL DATA:  Two months of radial wrist pain. EXAM: LEFT WRIST - COMPLETE 3+ VIEW  COMPARISON:  None. FINDINGS: There is no evidence of fracture or dislocation. There is no evidence of  arthropathy or other focal bone abnormality. Soft tissues are unremarkable. IMPRESSION: Negative. Electronically Signed   By: Maudry Mayhew M.D.   On: 04/25/2021 18:58   DG Shoulder Left  Result Date: 04/25/2021 CLINICAL DATA:  Six months of left shoulder pain and numbness. No known injury. EXAM: LEFT SHOULDER - 2+ VIEW COMPARISON:  None. FINDINGS: There is no evidence of fracture or dislocation. There is no evidence of arthropathy or other focal bone abnormality. Soft tissues are unremarkable. Visualized lung fields are clear. IMPRESSION: Negative. Electronically Signed   By: Maudry Mayhew M.D.   On: 04/25/2021 18:56    Procedures Procedures (including critical care time)  UC Diagnoses / Final Clinical Impressions(s)   I have reviewed the triage vital signs and the nursing notes.  Pertinent labs & imaging results that were available during my care of the patient were reviewed by me and considered in my medical decision making (see chart for details).    Final diagnoses:  Left wrist pain  Acute pain of left shoulder   X-ray of left wrist and right shoulder are unremarkable.  Patient advised.  Patient advised to follow-up with primary care for referral to orthopedics.  ED Prescriptions   None    PDMP not reviewed this encounter.  Pending results:  Labs Reviewed - No data to display  Medications Ordered in UC: Medications - No data to display  Disposition Upon Discharge:  Condition: stable for discharge home Home: take medications as prescribed; routine discharge instructions as discussed; follow up as advised.  Patient presented with an acute illness with associated systemic symptoms and significant discomfort requiring urgent management. In my opinion, this is a condition that a prudent lay person (someone who possesses an average knowledge of health and medicine) may  potentially expect to result in complications if not addressed urgently such as respiratory distress, impairment of bodily function or dysfunction of bodily organs.   Routine symptom specific, illness specific and/or disease specific instructions were discussed with the patient and/or caregiver at length.   As such, the patient has been evaluated and assessed, work-up was performed and treatment was provided in alignment with urgent care protocols and evidence based medicine.  Patient/parent/caregiver has been advised that the patient may require follow up for further testing and treatment if the symptoms continue in spite of treatment, as clinically indicated and appropriate.  If the patient was tested for COVID-19, Influenza and/or RSV, then the patient/parent/guardian was advised to isolate at home pending the results of his/her diagnostic coronavirus test and potentially longer if theyre positive. I have also advised pt that if his/her COVID-19 test returns positive, it's recommended to self-isolate for at least 10 days after symptoms first appeared AND until fever-free for 24 hours without fever reducer AND other symptoms have improved or resolved. Discussed self-isolation recommendations as well as instructions for household member/close contacts as per the [facility] and [place] DHHS, and also gave patient the COVID packet with this information.  Patient/parent/caregiver has been advised to return to the [facility] or PCP in 3-5 days if no better; to PCP or the Emergency Department if new signs and symptoms develop, or if the current signs or symptoms continue to change or worsen for further workup, evaluation and treatment as clinically indicated and appropriate  The patient will follow up with their current PCP if and as advised. If the patient does not currently have a PCP we will assist them in obtaining one.   The patient may need specialty follow up  if the symptoms continue, in spite of conservative treatment  and management, for further workup, evaluation, consultation and treatment as clinically indicated and appropriate.   Patient/parent/caregiver verbalized understanding and agreement of plan as discussed.  All questions were addressed during visit.  Please see discharge instructions below for further details of plan.  Discharge Instructions:   Discharge Instructions      Please follow-up with your primary care provider.  Is my opinion that you need to be evaluated by an orthopedic specialist for issues such as carpal tunnel syndrome of your wrists or shoulder impingement syndrome of your left shoulder, we are unable to evaluate your diagnosed you with either of these conditions here in the urgent care setting.  I provided you with a note to be out of work for a few days to see if rest helps relieve your pain.    This office note has been dictated using Teaching laboratory technician.  Unfortunately, and despite my best efforts, this method of dictation can sometimes lead to occasional typographical or grammatical errors.  I apologize in advance if this occurs.     Theadora Rama Scales, [place] 04/25/21 1905

## 2021-04-25 NOTE — Discharge Instructions (Addendum)
Please follow-up with your primary care provider.  Is my opinion that you need to be evaluated by an orthopedic specialist for issues such as carpal tunnel syndrome of your wrists or shoulder impingement syndrome of your left shoulder, we are unable to evaluate your diagnosed you with either of these conditions here in the urgent care setting.  I provided you with a note to be out of work for a few days to see if rest helps relieve your pain.

## 2021-05-01 ENCOUNTER — Encounter: Payer: Self-pay | Admitting: Registered Nurse

## 2021-05-01 ENCOUNTER — Ambulatory Visit (INDEPENDENT_AMBULATORY_CARE_PROVIDER_SITE_OTHER): Payer: 59 | Admitting: Registered Nurse

## 2021-05-01 VITALS — HR 77 | Temp 98.3°F | Resp 16 | Ht 71.0 in | Wt 173.0 lb

## 2021-05-01 DIAGNOSIS — M25432 Effusion, left wrist: Secondary | ICD-10-CM

## 2021-05-01 DIAGNOSIS — M25532 Pain in left wrist: Secondary | ICD-10-CM

## 2021-05-01 MED ORDER — DICLOFENAC SODIUM 1 % EX GEL: 2.0000 g | Freq: Four times a day (QID) | CUTANEOUS | 0 refills | Status: DC

## 2021-05-01 NOTE — Progress Notes (Signed)
? ?New Patient Office Visit ? ?Subjective:  ?Patient ID: Kelly Tate, female    DOB: 06/21/1995  Age: 26 y.o. MRN: [id number] ? ?CC:  ?Chief Complaint  ?Patient presents with  ? New Patient (Initial Visit)  ?  Patient states she is here to establish care .  ? ? ?HPI ?Kelly Tate presents for visit to est care ? ?Her primary concern today is an injury she sustained at work.  ?We discussed in depth that we do not process workman's comp claims through our office and that, should she want to ensure coverage for services, she should receive care from a provider she is referred to by her employer ([employer]). By agreeing to receive care today, she acknowledges that services rendered may not be covered under a workman's comp claim. She voices understanding and agreement to proceed. ? ?She notes chronic pain of wrists, L>R. 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 Still gives pain. Minimal at rest, but when doing any activity with hands, aching. Mostly at radial styloid. Notes it causes sensory disturbance in thumb.  ?She does note some L shoulder pain and superficial numbness on the upper L portion of her back. She states this has been going on about as long as the pain has been.  ?She has tried prednisone, NSAIDs, tylenol, and muscle relaxers without relief.  ? ?She denies limits to ROM, swelling, redness, fam hx of autoimmune processes, nodes on joints, lower extremity joint pains, rash, headaches, visual changes, shob, doe, chest pain, blanching or hyperpigmentation of skin. ? ?Otherwise no acute concerns at this time.  ? ? ?No past medical history on file. ? ?Past Surgical History:  ?Procedure Laterality Date  ? LAPAROSCOPIC OVARIAN CYSTECTOMY Right 03/01/2015  ? Procedure: LAPAROSCOPIC OVARIAN CYSTECTOMY;  Surgeon: Jaymes Graff, MD;  Location: [facility];  Service: Gynecology;  Laterality: Right;  ? ? ?No family history on file. ? ?Social History  ? ?Socioeconomic  History  ? Marital status: Single  ?  Spouse name: Not on file  ? Number of children: Not on file  ? Years of education: Not on file  ? Highest education level: Not on file  ?Occupational History  ? Not on file  ?Tobacco Use  ? Smoking status: Never  ? Smokeless tobacco: Not on file  ?Vaping Use  ? Vaping Use: Never used  ?Substance and Sexual Activity  ? Alcohol use: No  ? Drug use: Not Currently  ?  Types: Marijuana  ?  Comment: social  ? Sexual activity: Yes  ?  Birth control/protection: None  ?Other Topics Concern  ? Not on file  ?Social History Narrative  ? Not on file  ? ?Social Determinants of Health  ? ?Financial Resource Strain: Not on file  ?Food Insecurity: Not on file  ?Transportation Needs: Not on file  ?Physical Activity: Not on file  ?Stress: Not on file  ?Social Connections: Not on file  ?Intimate Partner Violence: Not on file  ? ? ?ROS ?Review of Systems ?Per hpi  ? ?Objective:  ? ?Today's Vitals: Pulse 77   Temp 98.3 ?F (36.8 ?C) (Temporal)   Resp 16   Ht 5\' 11"  (1.803 m)   Wt 173 lb (78.5 kg)   SpO2 97%   BMI 24.13 kg/m?  ? ?Physical Exam ?Vitals and nursing note reviewed.  ?Constitutional:   ?   General: She is not in acute distress. ?   Appearance: Normal appearance. She  is not ill-appearing, toxic-appearing or diaphoretic.  ?Cardiovascular:  ?   Rate and Rhythm: Normal rate and regular rhythm.  ?   Pulses: Normal pulses.  ?   Heart sounds: Normal heart sounds. No murmur heard. ?  No friction rub. No gallop.  ?Pulmonary:  ?   Effort: Pulmonary effort is normal. No respiratory distress.  ?   Breath sounds: Normal breath sounds. No stridor. No wheezing, rhonchi or rales.  ?Chest:  ?   Chest wall: No tenderness.  ?Musculoskeletal:     ?   General: Swelling and tenderness (L radial styloid) present. Normal range of motion.  ?Skin: ?   General: Skin is warm and dry.  ?   Capillary Refill: Capillary refill takes less than 2 seconds.  ?Neurological:  ?   General: No focal deficit present.  ?    Mental Status: She is alert and oriented to person, place, and time. Mental status is at baseline.  ?Psychiatric:     ?   Mood and Affect: Mood normal.     ?   Behavior: Behavior normal.     ?   Thought Content: Thought content normal.     ?   Judgment: Judgment normal.  ? ? ?Assessment & Plan:  ? ?Problem List Items Addressed This Visit   ?None ? ? ?Outpatient Encounter Medications as of 05/01/2021  ?Medication Sig  ? etonogestrel (NEXPLANON) 68 MG IMPL implant Nexplanon 68 mg subdermal implant ? Inject 1 implant by subcutaneous route.  ? ?No facility-administered encounter medications on file as of 05/01/2021.  ? ? ?Follow-up: No follow-ups on file.  ? ?Kelly Agee, NP ? ?

## 2021-05-01 NOTE — Patient Instructions (Addendum)
Kelly Tate -  ? ?Great to meet you! ? ?Call with any concerns ? ?I have referred you to Orthopedics for further assessment of that wrist. ? ?Ok to use diclofenac on it four times daily as needed ?Wear wrist brace at night ?Continue with ice and heat ? ?Come back before the end of the year for a physical and labs ? ?Thank you ? ?Rich  ? ? ? ?If you have lab work done today you will be contacted with your lab results within the next 2 weeks.  If you have not heard from [place] then please contact us. The fastest way to get your results is to register for My Chart. ? ? ?IF you received an x-ray today, you will receive an invoice from [facility]. Please contact [facility] at [phone] with questions or concerns regarding your invoice.  ? ?IF you received labwork today, you will receive an invoice from [facility]. Please contact [facility] at [phone] with questions or concerns regarding your invoice.  ? ?Our billing staff will not be able to assist you with questions regarding bills from these companies. ? ?You will be contacted with the lab results as soon as they are available. The fastest way to get your results is to activate your My Chart account. Instructions are located on the last page of this paperwork. If you have not heard from [place] regarding the results in 2 weeks, please contact this office. ?  ? ? ?

## 2021-05-02 ENCOUNTER — Telehealth: Payer: Self-pay | Admitting: Registered Nurse

## 2021-05-02 NOTE — Telephone Encounter (Signed)
Pt called in stating that she was to get an appt for shoulder pain as well, please advise  ?

## 2021-05-03 ENCOUNTER — Ambulatory Visit: Payer: 59 | Admitting: Orthopedic Surgery

## 2021-05-03 NOTE — Telephone Encounter (Signed)
Patient states she was suppose to have an appt for shoulder pain as well was this discussed at the visit? ? ? ?Pt called in stating that she was to get an appt for shoulder pain as well, please advise  ? ?

## 2021-05-03 NOTE — Telephone Encounter (Signed)
Looks like [facility] reached out to her ?She can reach them at  ? ?Aldean Baker ?[phone] ? ?Thanks, ? ?Rich

## 2021-05-07 ENCOUNTER — Telehealth: Payer: Self-pay | Admitting: Orthopaedic Surgery

## 2021-05-07 ENCOUNTER — Ambulatory Visit: Payer: Self-pay

## 2021-05-07 ENCOUNTER — Telehealth: Payer: Self-pay | Admitting: Physician Assistant

## 2021-05-07 ENCOUNTER — Ambulatory Visit (INDEPENDENT_AMBULATORY_CARE_PROVIDER_SITE_OTHER): Admitting: Orthopaedic Surgery

## 2021-05-07 ENCOUNTER — Other Ambulatory Visit: Payer: Self-pay

## 2021-05-07 ENCOUNTER — Encounter: Payer: Self-pay | Admitting: Orthopaedic Surgery

## 2021-05-07 VITALS — Ht 71.0 in | Wt 170.0 lb

## 2021-05-07 DIAGNOSIS — G8929 Other chronic pain: Secondary | ICD-10-CM

## 2021-05-07 DIAGNOSIS — M25512 Pain in left shoulder: Secondary | ICD-10-CM | POA: Diagnosis not present

## 2021-05-07 DIAGNOSIS — M25532 Pain in left wrist: Secondary | ICD-10-CM | POA: Diagnosis not present

## 2021-05-07 MED ORDER — DICLOFENAC SODIUM 75 MG PO TBEC: 75.0000 mg | DELAYED_RELEASE_TABLET | Freq: Two times a day (BID) | ORAL | 2 refills | Status: DC | PRN

## 2021-05-07 NOTE — Telephone Encounter (Signed)
Received $25.00 cash,medical records release form and FMLA paperwork from patient/Forwarding to [employer] today 

## 2021-05-07 NOTE — Progress Notes (Signed)
? ?Office Visit Note ?  ?Patient: Psychologist, educational           ?Date of Birth: [birth year]           ?MRN: [id number] ?Visit Date: 05/07/2021 ?             ?Requested by: Janeece Agee, NP ?[phone] [place] [address] ?[place],  [place] [address] ?PCP: Janeece Agee, NP ? ? ?Assessment & Plan: ?Visit Diagnoses:  ?1. Chronic left shoulder pain   ?2. Pain in left wrist   ? ? ?Plan: Impression is chronic left wrist and forearm pain and left upper extremity radiculopathy, most likely from overuse injury from previous and current working conditions.  In regards to the left wrist and forearm pain, I do not feel as though she has some sort of arthritis, sprain or tendinitis.  I would however like to settle this down with the use of a splint for which she already has in addition to anti-inflammatories.  Would also like to keep her out of work for 4 weeks to avoid any activity of the left wrist.  In regards to the left upper extremity paresthesias, she has tried steroids and muscle relaxers in the past without relief.  She has not previously been to physical therapy so I would like to try a course of therapy.  I will also start her on an anti-inflammatory.  She will follow-up with [place] in 4 weeks time for recheck.  Work note provided to be out for the next 4 weeks. ? ?Follow-Up Instructions: Return in about 4 weeks (around 06/04/2021).  ? ?Orders:  ?Orders Placed This Encounter  ?Procedures  ? XR Cervical Spine 2 or 3 views  ? ?Meds ordered this encounter  ?Medications  ? diclofenac (VOLTAREN) 75 MG EC tablet  ?  Sig: Take 1 tablet (75 mg total) by mouth 2 (two) times daily as needed.  ?  Dispense:  60 tablet  ?  Refill:  2  ? ? ? ? Procedures: ?No procedures performed ? ? ?Clinical Data: ?No additional findings. ? ? ?Subjective: ?Chief Complaint  ?Patient presents with  ? Left Wrist - Pain  ? Left Shoulder - Pain  ? ? ?HPI patient is a pleasant 26 year old right-hand-dominant female who comes in today with a work comp injury.  She notes that  she has been having left wrist pain and left shoulder paresthesias for about 6 weeks.  She notes that at work her position changed back in October 2022.  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  The symptoms she has are primarily described as pain to the base of the thumb and into the [facility] joint which radiates into the volar forearm.  She denies any pain to the first dorsal compartment or the dorsum of the forearm.  Symptoms appear to be worse when sorting mail and with any other activities.  She was initially seen by an urgent care where she was prescribed muscle relaxers and prednisone without relief.  She again changed jobs about 3 weeks ago without any real relief from her symptoms.  She denies any paresthesias into the left hand.  She does note slight tenderness to the top of the shoulder into the lateral neck as well as paresthesias to that same area.  No history of shoulder or neck pathology. ? ?Review of Systems  as detailed in HPI.  All others reviewed and are negative. ? ? ?Objective: ?Vital Signs: Ht 5\' 11"  (1.803 m)   Wt 170 lb (77.1 kg)   BMI 23.71 kg/m?  ? ?Physical Exam well-developed well-nourished female no acute distress.  Alert and oriented x3. ? ?Ortho Exam left wrist exam shows moderate tenderness along the base of the thumb and into the [facility] joint.  She has increased pain with wrist flexion.  Negative Finkelstein.  Left shoulder exam is unremarkable.  She does have mild tenderness along the trapezius muscle and into the parascapular region.  She has slight increased pain with neck flexion and extension.  No focal weakness.  She is neurovascular intact distally. ? ?Specialty Comments:  ?No specialty comments available. ? ?Imaging: ?No results found. ? ? ?PMFS History: ?There are no problems to display for this patient. ? ?No past medical history on file.   ?Family History  ?Problem Relation Age of Onset  ? Breast cancer Mother   ? COPD Father   ?  ?Past Surgical History:  ?Procedure Laterality Date  ? LAPAROSCOPIC OVARIAN CYSTECTOMY Right 03/01/2015  ? Procedure: LAPAROSCOPIC OVARIAN CYSTECTOMY;  Surgeon: 03/03/2015, MD;  Location: [facility];  Service: Gynecology;  Laterality: Right;  ? ?Social History  ? ?Occupational History  ? Not on file  ?Tobacco Use  ? Smoking status: Never  ? Smokeless tobacco: Not on file  ?Vaping Use  ? Vaping Use: Never used  ?Substance and Sexual Activity  ? Alcohol use: Yes  ?  Comment: occ  ? Drug use: Not Currently  ?  Types: Marijuana  ?  Comment: social  ? Sexual activity: Yes  ?  Birth control/protection: None  ? ? ? ? ? ? ?

## 2021-05-07 NOTE — Telephone Encounter (Signed)
Pt asked for a call back need a letter to sent to her job and she needs to explain to [place] exactly what it need to say. Pt phon e number is [phone] ?

## 2021-05-07 NOTE — Telephone Encounter (Signed)
Have you gotten email on what needs to be said? ? ?

## 2021-05-08 ENCOUNTER — Telehealth: Payer: Self-pay | Admitting: Physician Assistant

## 2021-05-08 NOTE — Telephone Encounter (Signed)
Note made. Also sent patient mychart msg ? ?

## 2021-05-08 NOTE — Telephone Encounter (Signed)
I talked to pt. Pt states that it needs to be very detailed. She wants it to say "She is unable to work because of her chronic left wrist and forearm pain. And then list like things that she is not able to do which is why is she is not working." ? ?CB [id number]  ?

## 2021-05-08 NOTE — Telephone Encounter (Signed)
Pt called stating that PA Stanbery need to be specify more of that her issues are for workman's comp. Pt states PA Stanbery really didn't listen her her issues and needed more detailed notes for workman's comp. Please call pt about this matter at [phone]. ?

## 2021-05-08 NOTE — Telephone Encounter (Signed)
Ok to do this and please add unable to push, pull, lift or perform repetitive motion with LUE.  Needs her to completely rest for 4 weeks

## 2021-05-08 NOTE — Telephone Encounter (Signed)
What did she tell you yesterday kelsey. ? ?

## 2021-05-08 NOTE — Telephone Encounter (Signed)
I have not received the email with the guidelines of what needs to be said.  ?

## 2021-05-09 ENCOUNTER — Other Ambulatory Visit: Payer: Self-pay

## 2021-05-09 DIAGNOSIS — M542 Cervicalgia: Secondary | ICD-10-CM

## 2021-05-09 NOTE — Telephone Encounter (Signed)
I explained as much as I could as she never had an actual injury at work.  Not sure what else there is to even add without falsifying information which I will not do

## 2021-05-09 NOTE — Telephone Encounter (Signed)
Attempted to call patient. LVM

## 2021-05-16 ENCOUNTER — Telehealth: Payer: Self-pay | Admitting: Orthopedic Surgery

## 2021-05-16 NOTE — Telephone Encounter (Signed)
Ms. Ramanathan called in this morning to discuss information she needed to complete paperwork for her job.  Right now she is paying out of pocket for her visits, but is trying to get workers compensation.  She states that things are missing from her office note mainly a medical explanation/description of her job duties and how they could have possibly contributed to the condition she is seeking treatment for  (Repetitive motion aggravating or causing her condition).  She is upset because she feels that Mardella Layman did not listen to any of the details of her job duties or the problems she was having.  She said that someone called her "the other day" and offered her another appointment, but again she is paying out of pocket and feels like Mardella Layman should have listened the first time and she should not have to pay for another visit.  She would like someone to call her back to discuss [phone].  ?

## 2021-05-16 NOTE — Telephone Encounter (Signed)
I can assure you that I listened to her complaints, but it is hard to explain pain as a problem when there was not an actual injury.  I cannot and will not fabricate info so that this becomes a wc claim.

## 2021-05-21 ENCOUNTER — Telehealth: Payer: Self-pay | Admitting: Orthopaedic Surgery

## 2021-05-21 ENCOUNTER — Telehealth: Payer: Self-pay | Admitting: Physician Assistant

## 2021-05-21 NOTE — Telephone Encounter (Signed)
This pt has called about this same issue and wants someone to call her.  ? ?CB [phone]  ?

## 2021-05-21 NOTE — Telephone Encounter (Signed)
error 

## 2021-05-21 NOTE — Telephone Encounter (Signed)
Pt called again to speak to Lauren F. About her issue. Informed pt that her account is noted and that hopefully to receive a call tomorrow. Please see all note on pt's chart. ?

## 2021-05-22 ENCOUNTER — Telehealth: Payer: Self-pay

## 2021-05-22 NOTE — Telephone Encounter (Signed)
Tried calling to discuss concerns. No answer. LMVM advising was returning call  ?

## 2021-05-22 NOTE — Telephone Encounter (Signed)
Patient called in stating that she was referred to [facility] but has stated she would like to go elsewhere as [name] is delaying her process for workers comp. Kelly Tate says that [name] wrote her out of work for no reason and is delaying her process even further to get help she is just not happy with them.  ?

## 2021-05-22 NOTE — Telephone Encounter (Signed)
Will try to reach out to patient today.  ?

## 2021-05-24 ENCOUNTER — Other Ambulatory Visit: Payer: Self-pay | Admitting: Registered Nurse

## 2021-05-24 DIAGNOSIS — M25532 Pain in left wrist: Secondary | ICD-10-CM

## 2021-05-24 DIAGNOSIS — M25432 Effusion, left wrist: Secondary | ICD-10-CM

## 2021-05-24 NOTE — Telephone Encounter (Signed)
Placed a new referral ? ?Thanks, ? ?Rich

## 2021-05-24 NOTE — Telephone Encounter (Signed)
LVM for patient letting her know about the new referral. Asked that she call back with any questions ?

## 2021-10-28 ENCOUNTER — Ambulatory Visit: Payer: 59 | Admitting: Family Medicine

## 2021-10-30 ENCOUNTER — Ambulatory Visit (INDEPENDENT_AMBULATORY_CARE_PROVIDER_SITE_OTHER): Payer: 59 | Admitting: Family Medicine

## 2021-10-30 ENCOUNTER — Encounter: Payer: Self-pay | Admitting: Family Medicine

## 2021-10-30 VITALS — BP 124/70 | HR 76 | Temp 98.4°F | Resp 16 | Ht 71.0 in | Wt 178.2 lb

## 2021-10-30 DIAGNOSIS — Z23 Encounter for immunization: Secondary | ICD-10-CM | POA: Diagnosis not present

## 2021-10-30 DIAGNOSIS — S6991XA Unspecified injury of right wrist, hand and finger(s), initial encounter: Secondary | ICD-10-CM | POA: Diagnosis not present

## 2021-10-30 DIAGNOSIS — R102 Pelvic and perineal pain: Secondary | ICD-10-CM | POA: Insufficient documentation

## 2021-10-30 DIAGNOSIS — S91011A Laceration without foreign body, right ankle, initial encounter: Secondary | ICD-10-CM | POA: Diagnosis not present

## 2021-10-30 DIAGNOSIS — S81011A Laceration without foreign body, right knee, initial encounter: Secondary | ICD-10-CM

## 2021-10-30 DIAGNOSIS — K601 Chronic anal fissure: Secondary | ICD-10-CM | POA: Insufficient documentation

## 2021-10-30 MED ORDER — CEFDINIR 300 MG PO CAPS: 300.0000 mg | ORAL_CAPSULE | Freq: Two times a day (BID) | ORAL | 0 refills | Status: DC

## 2021-10-30 NOTE — Patient Instructions (Addendum)
Follow up as needed or as scheduled START the Cefdinir twice daily- take w/ food Continue to use peroxide on the open wounds We'll call you with your hand referral Keep area clean and dry Call with any questions or concerns Hang in there!!!

## 2021-10-30 NOTE — Progress Notes (Signed)
   Subjective:    Patient ID: Kelly Tate, female    DOB: 07/20/1995, 26 y.o.   MRN: [id number]  [place]PI [place]  Occurred Saturday.  R lower leg scraped from ankle to knee.  R ring finger w/ laceration.  Went to [facility] to be evaluated.  R ankle glued.  Finger laceration cleaned but not sutured.  No Tdap given.  Given ointment to apply to open areas.  R finger laceration doesn't hurt.  R ankle will swell and be somewhat sore in the mornings but otherwise feels ok.  Pt is R handed  Review of Systems For ROS see HPI     Objective:   Physical Exam Vitals reviewed.  Constitutional:      General: She is not in acute distress.    Appearance: Normal appearance. She is not ill-appearing.  HENT:     Head: Normocephalic and atraumatic.  Musculoskeletal:     Comments: R 4th finger w/ laceration of tip of finger pad w/ tufted appearance.  Nail and finger extensively bruised.  Able to flex and extend DIP and PIP joints w/ minimal discomfort  Skin:    General: Skin is warm and dry.     Comments: 2 lacerations on R anterior knee w/ oozing drainage  Laceration of R lateral ankle w/ derma-bond in place, oozing visible underneath glue  Superficial scratches/lacerations down anterior R lower leg  Neurological:     General: No focal deficit present.     Mental Status: She is alert and oriented to person, place, and time.  Psychiatric:        Mood and Affect: Mood normal.        Behavior: Behavior normal.        Thought Content: Thought content normal.           Assessment & Plan:   Injury R 4th finger- new.  Given the tufted appearance of the laceration and the extensive bruising of the finger, concern for open fracture.  Will hold on xrays at this time but rather place an urgent referral to hand specialist for complete evaluation and tx.  Spoke to [place]- PA on call for [employer] and he is going to reach out to pt to schedule.  Wound was  covered with telfa and wrapped w/ coban.  Tdap given.  Knee lacerations- new.  Areas were cleaned w/ peroxide.  Quite a bit of bubbling.  Given the yellow oozing from the wound and [place], will start 3rd generation cephalosporin as recommended for freshwater injuries/infections.  Pt expressed understanding and is in agreement w/ plan.   Ankle laceration- new.  Area was closed w/ derma-bond but this is concerning b/c pt stated that wound was not irrigated prior to closure.  Truman Hayward will cover possible infection and some drainage expressed from underneath glue edge in office.

## 2021-11-01 ENCOUNTER — Telehealth: Payer: Self-pay | Admitting: Registered Nurse

## 2021-11-01 NOTE — Telephone Encounter (Signed)
Pt should continue to apply peroxide to the area twice daily and pat dry.  The antibiotics that she is on should take care of potential infection

## 2021-11-01 NOTE — Telephone Encounter (Signed)
Spoke to patient and let her know what Dr Beverely Low advised. She stated she had been icing her foot and ankle as well and the swelling has gone down a lot but wanted to know if it doesn't get better over the weekend should she be seen again. I told patient that we will be closed on Monday, but definitely reach out after that if it's not better as well as seek out urgent care over the weekend if need be. Patient understood.   Kelly Tate

## 2021-11-01 NOTE — Telephone Encounter (Signed)
Caller name: Tobie Poet   On DPR? :yes/no: Yes  Call back number: [phone]  Provider they see: Janeece Agee   Reason for call: Pt had an appt yesterday with Dr.Tabori. Pt states she is having some reddish-brown and yellowish-green (thick) pus coming from the cut on her knee. Pt want to know what she should do about this. Please advise.

## 2022-01-27 ENCOUNTER — Encounter: Payer: 59 | Admitting: Registered Nurse

## 2022-07-17 ENCOUNTER — Encounter: Payer: Self-pay | Admitting: Family Medicine

## 2022-07-17 ENCOUNTER — Ambulatory Visit (INDEPENDENT_AMBULATORY_CARE_PROVIDER_SITE_OTHER): Payer: 59 | Admitting: Family Medicine

## 2022-07-17 VITALS — BP 110/70 | HR 85 | Temp 98.1°F | Wt 168.2 lb

## 2022-07-17 DIAGNOSIS — S39011A Strain of muscle, fascia and tendon of abdomen, initial encounter: Secondary | ICD-10-CM | POA: Diagnosis not present

## 2022-07-17 NOTE — Patient Instructions (Addendum)
Based on your exam today I think you strained the abdominal wall muscles.  See information below.  Although pain is in the area where hernias can present I did not appreciate a hernia on the exam today.  If pain continues in that area we can check some imaging to look at that further.  For now I recommend avoiding any heavy lifting, straining, or repetitive lifting until the abdominal wall soreness improves.  Okay to take Tylenol or Advil if needed, warm compress or cool compress if that feels better.  Recheck in 1 week if you are not evaluated by medical provider through your work.  Hope you feel better soon and let me know if there are questions.  Muscle Strain A muscle strain is an injury that occurs when a muscle is stretched beyond its normal length. Usually, a small number of muscle fibers are torn when this happens. There are three types of muscle strains. First-degree strains have the least amount of muscle fiber tearing and the least amount of pain. Second-degree and third-degree strains have more tearing and pain. Usually, recovery from muscle strain takes 1-2 weeks. Complete healing normally takes 5-6 weeks. What are the causes? This condition is caused when a sudden, violent force is placed on a muscle and stretches it too far. This may occur with a fall, while lifting, or during sports. What increases the risk? This condition is more likely to develop in athletes and people who are physically active. What are the signs or symptoms? Symptoms of this condition include: Pain. Tenderness. Bruising. Swelling. Trouble using the muscle. How is this diagnosed? This condition is diagnosed based on a physical exam and your medical history. Tests may also be done, including an X-ray, ultrasound, or MRI. How is this treated? This condition is initially treated with PRICE therapy. This therapy involves: Protecting the muscle from being injured again. Resting the injured muscle. Icing the  injured muscle. Applying pressure (compression) to the injured muscle. This may be done with a splint or elastic bandage. Raising (elevating) the injured muscle. Your health care provider may also recommend medicine for pain. Follow these instructions at home: If you have a removable splint: Wear the splint as told by your health care provider. Remove it only as told by your health care provider. Check the skin around the splint every day. Tell your health care provider about any concerns. Loosen the splint if your fingers or toes tingle, become numb, or turn cold and blue. Keep the splint clean. If the splint is not waterproof: Do not let it get wet. Cover it with a watertight covering when you take a bath or a shower. Managing pain, stiffness, and swelling  If directed, put ice on the injured area. To do this: If you have a removable splint, remove it as told by your health care provider. Put ice in a plastic bag. Place a towel between your skin and the bag. Leave the ice on for 20 minutes, 2-3 times a day. Remove the ice if your skin turns bright red. This is very important. If you cannot feel pain, heat, or cold, you have a greater risk of damage to the area. Move your fingers or toes often to reduce stiffness and swelling. Raise (elevate) the injured area above the level of your heart while you are sitting or lying down. Wear an elastic bandage as told by your health care provider. Make sure that it is not too tight. General instructions Take over-the-counter and prescription medicines  only as told by your health care provider. Treatment may include muscle relaxants or medicines for pain and inflammation that are taken by mouth or applied to the skin. Restrict your activity and rest the injured muscle as told by your health care provider. Gentle movements may be allowed. If physical therapy was prescribed, do exercises as told by your health care provider. Do not put pressure on any  part of the splint until it is fully hardened. This may take several hours. Do not use any products that contain nicotine or tobacco. These products include cigarettes, chewing tobacco, and vaping devices, such as e-cigarettes. If you need help quitting, ask your health care provider. Ask your health care provider when it is safe to drive if you have a splint. Keep all follow-up visits. This is important. How is this prevented? Warm up before exercising. This helps to prevent future muscle strains. Contact a health care provider if: You have more pain or swelling in the injured area. Get help right away if: You have numbness or tingling in the injured area. You lose a lot of strength in the injured area. Summary A muscle strain is an injury that occurs when a muscle is stretched beyond its normal length. This condition is caused when a sudden, violent force is placed on a muscle and stretches it too far. This condition is initially treated with PRICE therapy, which involves protecting, resting, icing, compressing, and elevating. Gentle movements may be allowed. If physical therapy was prescribed, do exercises as told by your health care provider. This information is not intended to replace advice given to you by your health care provider. Make sure you discuss any questions you have with your health care provider. Document Revised: 05/07/2020 Document Reviewed: 05/07/2020 Elsevier Patient Education  2023 [employer].

## 2022-07-17 NOTE — Progress Notes (Signed)
Subjective:  Patient ID: Kelly Tate, female    DOB: 01-07-1996  Age: 27 y.o. MRN: [id number]  CC:  Chief Complaint  Patient presents with   Acute Visit    Was lifting something last night at work and feels like she may have pulled a muscle.    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 Sack weighed about 15-20#. Felt pull in R lower abdomen. Tried to walk it off. Felt burning. Had to leave work early. Scheduled to work today. Advised supervisor yesterday - filled out leave slip, and went home.  Less soreness today with lying down. Still feels pull with walking today. Has not tried to lift today. Burning feeling into groin, not into leg. No visible bulge. Nausea with pain, no vomiting, no fever. No hematuria or difficulty urinating.   Tx: ice last night, then warm bath with epsom salts - both helped. No meds used.  Prior abdominal surgeries, ovarian cyst aspiration, no prior hernia or hernia repair.  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 Past Surgical History:  Procedure Laterality Date   LAPAROSCOPIC OVARIAN CYSTECTOMY Right 03/01/2015   Procedure: LAPAROSCOPIC OVARIAN CYSTECTOMY;  Surgeon: Jaymes Graff, MD;  Location: [facility];  Service: Gynecology;  Laterality: Right;   No Known Allergies Prior to Admission medications   Medication Sig Start Date End Date Taking? Authorizing Provider  medroxyPROGESTERone (DEPO-PROVERA) 150 MG/ML injection Inject 150 mg into the muscle every 3 (three) months.   Yes [provider]  cefdinir (OMNICEF) 300 MG capsule Take 1 capsule (300 mg total) by mouth 2 (two) times daily. Patient not  taking: Reported on 07/17/2022 10/30/21   Sheliah Hatch, MD  diclofenac (VOLTAREN) 75 MG EC tablet Take 1 tablet (75 mg total) by mouth 2 (two) times daily as needed. Patient not taking: Reported on 10/30/2021 05/07/21   Cristie Hem, PA-C  diclofenac Sodium (VOLTAREN) 1 % GEL Apply 2 g topically 4 (four) times daily. Patient not taking: Reported on 10/30/2021 05/01/21   Janeece Agee, NP  etonogestrel (NEXPLANON) 68 MG IMPL implant Nexplanon 68 mg subdermal implant  Inject 1 implant by subcutaneous route. Patient not taking: Reported on 07/17/2022    [provider]   Social History   Socioeconomic History   Marital status: Single    Spouse name: Not on file   Number of children: Not on file   Years of education: Not on file   Highest education level: Not on file  Occupational History   Not on file  Tobacco Use   Smoking status: Never   Smokeless tobacco: Not on file  Vaping Use   Vaping Use: Never used  Substance and Sexual Activity   Alcohol use: Yes    Comment: occ   Drug use: Not Currently    Types: Marijuana    Comment: social   Sexual activity: Yes    Birth control/protection: None  Other Topics Concern   Not on file  Social History Narrative   Not on file   Social Determinants of Health   Financial Resource Strain: Not on file  Food Insecurity: Not on file  Transportation Needs: Not on file  Physical Activity: Not on file  Stress: Not on file  Social Connections: Not on file  Intimate Partner Violence: Not on file    Review of Systems   Objective:   Vitals:   07/17/22 1457  BP: 110/70  Pulse: 85  Temp: 98.1 F (36.7 C)  SpO2: 99%  Weight: 168 lb 3.2 oz (76.3 kg)     Physical Exam Exam conducted with a chaperone present (Sheena for abdominal exam).  Constitutional:      General: She is not in acute distress.    Appearance: Normal appearance. She is well-developed.  HENT:     Head: Normocephalic and atraumatic.  Cardiovascular:      Rate and Rhythm: Normal rate.  Pulmonary:     Effort: Pulmonary effort is normal.  Abdominal:     General: Abdomen is flat. Bowel sounds are normal. There is no distension.     Tenderness: There is abdominal tenderness. There is no guarding or rebound. Negative signs include Murphy's sign.     Hernia: No hernia is present. There is no hernia in the umbilical area, ventral area, right femoral area or right inguinal area.    Neurological:     Mental Status: She is alert and oriented to person, place, and time.  Psychiatric:        Mood and Affect: Mood normal.        Assessment & Plan:  Kelly Tate is a 27 y.o. female . Strain of abdominal wall, initial encounter Suspected strain of abdominal wall musculature, right side after lifting yesterday.  No appreciable defect, pain anterior groin but no appreciable hernia or swelling.  -Recommended against heavy lifting, repetitive lifting, straining for now.  Symptomatic care with Tylenol, over-the-counter Advil as needed, heat or ice, and recheck in 1 week.  Plans to discuss with her employer and can follow-up with medical provider through employer or myself if needed.  Consider imaging if persistent pain, especially into groin to rule out hernia.  RTC precautions.   No orders of the defined types were placed in this encounter.  Patient Instructions  Based on your exam today I think you strained the abdominal wall muscles.  See information below.  Although pain is not in the area where hernias can present I did not appreciate a hernia on the exam today.  If pain continues in that area we can check some imaging to look at that further.  For now I recommend avoiding any heavy lifting, straining, or repetitive lifting until the abdominal wall soreness improves.  Okay to take Tylenol or Advil if needed, warm compress or cool compress if that feels better.  Recheck in 1 week if you are not evaluated by medical provider through your work.   Hope you feel better soon and let me know if there are questions.  Muscle Strain A muscle strain is an injury that occurs when a muscle is stretched beyond its normal length. Usually, a small number of muscle fibers are torn when this happens. There are three types of muscle strains. First-degree strains have the least amount of muscle fiber tearing and the least amount of pain. Second-degree and third-degree strains have more tearing and pain. Usually, recovery from muscle strain takes 1-2 weeks. Complete healing normally takes 5-6 weeks. What are the causes? This condition is caused when a sudden, violent force is placed on a muscle and stretches it too far. This may occur with a fall, while lifting, or  during sports. What increases the risk? This condition is more likely to develop in athletes and people who are physically active. What are the signs or symptoms? Symptoms of this condition include: Pain. Tenderness. Bruising. Swelling. Trouble using the muscle. How is this diagnosed? This condition is diagnosed based on a physical exam and your medical history. Tests may also be done, including an X-ray, ultrasound, or MRI. How is this treated? This condition is initially treated with PRICE therapy. This therapy involves: Protecting the muscle from being injured again. Resting the injured muscle. Icing the injured muscle. Applying pressure (compression) to the injured muscle. This may be done with a splint or elastic bandage. Raising (elevating) the injured muscle. Your health care provider may also recommend medicine for pain. Follow these instructions at home: If you have a removable splint: Wear the splint as told by your health care provider. Remove it only as told by your health care provider. Check the skin around the splint every day. Tell your health care provider about any concerns. Loosen the splint if your fingers or toes tingle, become numb, or turn cold and blue. Keep  the splint clean. If the splint is not waterproof: Do not let it get wet. Cover it with a watertight covering when you take a bath or a shower. Managing pain, stiffness, and swelling  If directed, put ice on the injured area. To do this: If you have a removable splint, remove it as told by your health care provider. Put ice in a plastic bag. Place a towel between your skin and the bag. Leave the ice on for 20 minutes, 2-3 times a day. Remove the ice if your skin turns bright red. This is very important. If you cannot feel pain, heat, or cold, you have a greater risk of damage to the area. Move your fingers or toes often to reduce stiffness and swelling. Raise (elevate) the injured area above the level of your heart while you are sitting or lying down. Wear an elastic bandage as told by your health care provider. Make sure that it is not too tight. General instructions Take over-the-counter and prescription medicines only as told by your health care provider. Treatment may include muscle relaxants or medicines for pain and inflammation that are taken by mouth or applied to the skin. Restrict your activity and rest the injured muscle as told by your health care provider. Gentle movements may be allowed. If physical therapy was prescribed, do exercises as told by your health care provider. Do not put pressure on any part of the splint until it is fully hardened. This may take several hours. Do not use any products that contain nicotine or tobacco. These products include cigarettes, chewing tobacco, and vaping devices, such as e-cigarettes. If you need help quitting, ask your health care provider. Ask your health care provider when it is safe to drive if you have a splint. Keep all follow-up visits. This is important. How is this prevented? Warm up before exercising. This helps to prevent future muscle strains. Contact a health care provider if: You have more pain or swelling in the injured  area. Get help right away if: You have numbness or tingling in the injured area. You lose a lot of strength in the injured area. Summary A muscle strain is an injury that occurs when a muscle is stretched beyond its normal length. This condition is caused when a sudden, violent force is placed on a muscle and stretches it too far. This  condition is initially treated with PRICE therapy, which involves protecting, resting, icing, compressing, and elevating. Gentle movements may be allowed. If physical therapy was prescribed, do exercises as told by your health care provider. This information is not intended to replace advice given to you by your health care provider. Make sure you discuss any questions you have with your health care provider. Document Revised: 05/07/2020 Document Reviewed: 05/07/2020 Elsevier Patient Education  2023 [employer] Inc[employer]     Signed,   Meredith Staggers, MD [facility], [facility] 07/17/22 3:43 PM

## 2022-07-24 ENCOUNTER — Encounter: Payer: Self-pay | Admitting: Family Medicine

## 2022-07-24 ENCOUNTER — Ambulatory Visit (INDEPENDENT_AMBULATORY_CARE_PROVIDER_SITE_OTHER): Payer: 59 | Admitting: Family Medicine

## 2022-07-24 VITALS — BP 124/70 | HR 84 | Temp 98.0°F | Ht 71.0 in | Wt 171.8 lb

## 2022-07-24 DIAGNOSIS — N920 Excessive and frequent menstruation with regular cycle: Secondary | ICD-10-CM | POA: Diagnosis not present

## 2022-07-24 DIAGNOSIS — R1031 Right lower quadrant pain: Secondary | ICD-10-CM

## 2022-07-24 DIAGNOSIS — S39011A Strain of muscle, fascia and tendon of abdomen, initial encounter: Secondary | ICD-10-CM

## 2022-07-24 LAB — POCT URINALYSIS DIP (MANUAL ENTRY)
Bilirubin, UA: NEGATIVE
Glucose, UA: NEGATIVE mg/dL
Ketones, POC UA: NEGATIVE mg/dL
Leukocytes, UA: NEGATIVE
Nitrite, UA: NEGATIVE
Spec Grav, UA: >=1.03 — AB (ref 1.010–1.025)
Urobilinogen, UA: 0.2 E.U./dL
pH, UA: 5.5 (ref 5.0–8.0)

## 2022-07-24 LAB — POCT URINE PREGNANCY: Preg Test, Ur: NEGATIVE

## 2022-07-24 NOTE — Patient Instructions (Signed)
We can try further restrictions to see if that will allow some improvements soreness, as it still appears to be a pulled muscle.  Tylenol or occasional Aleve are fine for now.  Recheck in 1 week.  If any new or worsening symptoms during that time be seen sooner.  Here, urgent care or emergency room if needed.

## 2022-07-24 NOTE — Progress Notes (Signed)
Subjective:  Patient ID: Kelly Tate, female    DOB: Jul 12, 1995  Age: 27 y.o. MRN: [id number]  CC:  Chief Complaint  Patient presents with   Abdominal Pain    Pt notes sxs are about the same notes too if she does not go to the bathroom quickly when she needs to the pain will become more severe on her lower right     HPI Kelly Tate presents for   Abdominal pain Follow-up from May 16 evaluation, had noted pain after lifting at work and night prior.  Felt a burning and pull sensation in her right lower abdomen.  Less soreness with lying down, still felt a pull with walking day of evaluation.  No visible bulge, no hernia appreciated on exam, no urinary symptoms at that time. , She was tender of the abdominal wall musculature right mid abdomen, right lower abdomen without defect appreciated on exam.  Appear to be possible abdominal wall strain.  Activity modification discussed including avoid heavy lifting, repetitive lifting or straining and symptomatic care with Tylenol, over-the-counter Advil as needed along with heat or ice.  Also recommended discussing with her employer.  Today, notes pain is about the same, sore to bend forward. Sore in R side if not going to urinate right away. No pain with urination, but cramping pain in lower right side of abdomen - resolves with urination. No hematuria.  Nausea with walking. No fever, no vomiting.  Normal bowel movements, no pain with BM.   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  Tx: alleve - at bedtime - last week only. Tylenol 2 every 4 hours at work only.  Discussed with employer - recommended eval and diagnosis with her physician.  Works Wednesday through Sunday.    LMP - on Depoprovera, spotting recently, but recently started depoprovera 1 month ago - prior nexplanon.     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 Past Surgical History:  Procedure Laterality Date   LAPAROSCOPIC OVARIAN CYSTECTOMY Right 03/01/2015   Procedure: LAPAROSCOPIC OVARIAN CYSTECTOMY;  Surgeon: Jaymes Graff, MD;  Location: [facility];  Service: Gynecology;  Laterality: Right;   No Known Allergies Prior to Admission medications   Medication Sig Start Date End Date Taking? Authorizing Provider  medroxyPROGESTERone (DEPO-PROVERA) 150 MG/ML injection Inject 150 mg into the muscle every 3 (three) months.    [provider]   Social History   Socioeconomic History   Marital status: Single    Spouse name: Not on file   Number of children: Not on file   Years of education: Not on file   Highest education level: Not on file  Occupational History   Not on file  Tobacco Use   Smoking status: Never   Smokeless tobacco: Not on file  Vaping Use   Vaping Use: Never used  Substance and Sexual Activity   Alcohol use: Yes    Comment: occ   Drug use: Not Currently    Types: Marijuana    Comment: social   Sexual activity: Yes  Birth control/protection: None  Other Topics Concern   Not on file  Social History Narrative   Not on file   Social Determinants of Health   Financial Resource Strain: Not on file  Food Insecurity: Not on file  Transportation Needs: Not on file  Physical Activity: Not on file  Stress: Not on file  Social Connections: Not on file  Intimate Partner Violence: Not on file    Review of Systems Per HPI  Objective:   Vitals:   07/24/22 1452  BP: 124/70  Pulse: 84  Temp: 98 F (36.7 C)  TempSrc: Temporal  SpO2: 100%  Weight: 171 lb 12.8 oz (77.9 kg)  Height: 5\' 11"  (1.803  m)     Physical Exam Vitals reviewed. Exam conducted with a chaperone present Archie Patten present for exam).  Constitutional:      General: She is not in acute distress.    Appearance: Normal appearance. She is well-developed.  HENT:     Head: Normocephalic and atraumatic.  Cardiovascular:     Rate and Rhythm: Normal rate.  Pulmonary:     Effort: Pulmonary effort is normal.  Abdominal:     General: Abdomen is flat. Bowel sounds are normal. There is no distension.     Tenderness: There is abdominal tenderness (Right lower quadrant, abdominal wall musculature, toward inguinal fold, no bulge or defect appreciated including with cough.).  Neurological:     Mental Status: She is alert and oriented to person, place, and time.  Psychiatric:        Mood and Affect: Mood normal.     Assessment & Plan:  Kelly Tate is a 27 y.o. female . Strain of abdominal wall, initial encounter RLQ abdominal pain - Plan: POCT urine pregnancy, POCT urinalysis dipstick Right groin pain  - abdominal wall strain, groin strain by history. Symptoms overall similar. Sore with bending at work.  Will adjust restrictions, recheck 1 week with RTC/ER precautions if new or worsening symptoms sooner.  If persistent pain, consider imaging to evaluate for hernia.   Spotting - Plan: POCT urine pregnancy, POCT urinalysis dipstick  -Likely due to recent change from Nexplanon to Depo-Provera.  No urinary symptoms other than discomfort in abdomen prior to urination.  Recheck in 1 week.  RTC precautions. No orders of the defined types were placed in this encounter.  Patient Instructions  We can try further restrictions to see if that will allow some improvements soreness, as it still appears to be a pulled muscle.  Tylenol or occasional Aleve are fine for now.  Recheck in 1 week.  If any new or worsening symptoms during that time be seen sooner.  Here, urgent care or emergency room if needed.      Signed,   Meredith Staggers, MD [facility], [facility] 07/24/22 6:16 PM

## 2022-07-25 ENCOUNTER — Telehealth: Payer: Self-pay | Admitting: Family Medicine

## 2022-07-25 NOTE — Telephone Encounter (Signed)
[facility] faxed and placed in scan

## 2022-07-25 NOTE — Telephone Encounter (Signed)
Left pt a Vm stating there was no fax number on form so they are in the front filing cabinet for her to pickup. Copies in scan

## 2022-07-25 NOTE — Telephone Encounter (Signed)
Patient dropped off paperwork to be filled out by provider (duty status report) as she currently works at [employer] and needs this to return to work. She had an appt with Dr. Neva Seat on 5/23 but paperwork was not given until yesterday at 6pm.  Please call [phone] when form is ready for pick up or with any futher questions.  Thank you !

## 2022-07-25 NOTE — Telephone Encounter (Signed)
Paperwork completed and placed in fax bin at back nurse station  

## 2022-07-25 NOTE — Telephone Encounter (Signed)
Placed in your sign folder  

## 2022-07-31 ENCOUNTER — Ambulatory Visit (INDEPENDENT_AMBULATORY_CARE_PROVIDER_SITE_OTHER): Payer: 59 | Admitting: Family Medicine

## 2022-07-31 ENCOUNTER — Encounter: Payer: Self-pay | Admitting: Family Medicine

## 2022-07-31 VITALS — BP 116/70 | HR 74 | Temp 97.6°F | Ht 71.0 in | Wt 170.4 lb

## 2022-07-31 DIAGNOSIS — R1031 Right lower quadrant pain: Secondary | ICD-10-CM

## 2022-07-31 DIAGNOSIS — S39011A Strain of muscle, fascia and tendon of abdomen, initial encounter: Secondary | ICD-10-CM

## 2022-07-31 NOTE — Progress Notes (Signed)
Subjective:  Patient ID: Kelly Tate, female    DOB: 1995-07-02  Age: 27 y.o. MRN: [id number]  CC:  Chief Complaint  Patient presents with   Abdominal Pain    Notes pretty much the same, notes elastic bands in pants and seat belt hurts the most no longer struggling with bowel movements has had some time off of work which did help     HPI Psychologist, educational presents for   Abdominal wall pain See prior visits, strain of abdominal wall evaluated May 16. Persistent symptoms on recheck May 23.  Adjusted her restrictions at that time. Feels about the same.  Nausea with walking, no vomiting.  Off work for 1 week. Felt better unless leaning forward. Back at work yesterday. Sore, cramping when sitting at work.  Normal bowel movements. Hurt to have the BM.  Overall feels about the same.  No pain meds needed this week.  Overall pain about the same.   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 No pertinent past medical history. Past Surgical History:  Procedure Laterality Date   LAPAROSCOPIC OVARIAN CYSTECTOMY Right 03/01/2015   Procedure: LAPAROSCOPIC OVARIAN CYSTECTOMY;  Surgeon: Jaymes Graff, MD;  Location: [facility];  Service: Gynecology;  Laterality: Right;   No Known Allergies Prior to Admission medications   Medication Sig Start Date End Date Taking? Authorizing Provider  medroxyPROGESTERone (DEPO-PROVERA) 150 MG/ML injection Inject 150 mg into the muscle every 3 (three) months.   Yes [provider]   Social History   Socioeconomic History   Marital status: Single    Spouse name: Not on file   Number of children: Not on file   Years of education: Not on file   Highest education level: Not on file  Occupational History    Not on file  Tobacco Use   Smoking status: Never   Smokeless tobacco: Not on file  Vaping Use   Vaping Use: Never used  Substance and Sexual Activity   Alcohol use: Yes    Comment: occ   Drug use: Not Currently    Types: Marijuana    Comment: social   Sexual activity: Yes    Birth control/protection: None  Other Topics Concern   Not on file  Social History Narrative   Not on file   Social Determinants of Health   Financial Resource Strain: Not on file  Food Insecurity: Not on file  Transportation Needs: Not on file  Physical Activity: Not on file  Stress: Not on file  Social Connections: Not on file  Intimate Partner Violence: Not on file    Review of Systems   Objective:   Vitals:   07/31/22 1535  BP: 116/70  Pulse: 74  Temp: 97.6 F (36.4 C)  TempSrc: Temporal  SpO2: 96%  Weight: 170 lb 6.4 oz (77.3 kg)  Height: 5\' 11"  (1.803 m)     Physical Exam Constitutional:      General: She is not in acute distress.    Appearance: Normal appearance. She is well-developed. She is not ill-appearing, toxic-appearing or diaphoretic.  HENT:     Head: Normocephalic and atraumatic.  Cardiovascular:     Rate and Rhythm: Normal rate.  Pulmonary:     Effort: Pulmonary effort is  normal.  Abdominal:     Palpations: Abdomen is soft.     Tenderness: There is abdominal tenderness in the right lower quadrant.     Comments: Tender right lower quadrant, similar areas previous.  No distention.  Neurological:     Mental Status: She is alert and oriented to person, place, and time.  Psychiatric:        Mood and Affect: Mood normal.        Assessment & Plan:  Kelly Tate is a 27 y.o. female . Right lower quadrant abdominal pain - Plan: CT Abdomen Pelvis Wo Contrast  Strain of abdominal wall, initial encounter Persistent right lower groin pain with intermittent nausea and as above, suspected abdominal wall strain but with minimal change in symptoms with rest, and  associated nausea, will check CT abdomen pelvis as above.  Rule out hernia, although no bowel changes and no hernia was appreciated on exam.  Consider specialist eval depending on results of CT.  Continue restrictions at work for now.  ER/RTC precautions given.  No orders of the defined types were placed in this encounter.  Patient Instructions  I will order CT scan to look for possible hernia, and then can decide if ortho or other specialist needed. No change in restrictions for now.  Return to the clinic or go to the nearest emergency room if any of your symptoms worsen or new symptoms occur.     Signed,   Meredith Staggers, MD [facility], [facility] 07/31/22 8:19 PM

## 2022-07-31 NOTE — Patient Instructions (Signed)
I will order CT scan to look for possible hernia, and then can decide if ortho or other specialist needed. No change in restrictions for now.  Return to the clinic or go to the nearest emergency room if any of your symptoms worsen or new symptoms occur.

## 2022-08-07 ENCOUNTER — Ambulatory Visit
Admission: RE | Admit: 2022-08-07 | Discharge: 2022-08-07 | Disposition: A | Discharge status: Home / Self Care | Payer: 59 | Source: Ambulatory Visit | Attending: Family Medicine | Admitting: Family Medicine

## 2022-08-07 DIAGNOSIS — R1031 Right lower quadrant pain: Secondary | ICD-10-CM

## 2022-08-14 ENCOUNTER — Ambulatory Visit: Payer: 59 | Admitting: Family Medicine

## 2022-08-21 ENCOUNTER — Ambulatory Visit (INDEPENDENT_AMBULATORY_CARE_PROVIDER_SITE_OTHER): Payer: 59 | Admitting: Family Medicine

## 2022-08-21 ENCOUNTER — Encounter: Payer: Self-pay | Admitting: Family Medicine

## 2022-08-21 VITALS — BP 124/60 | HR 61 | Temp 97.6°F | Ht 71.0 in | Wt 165.6 lb

## 2022-08-21 DIAGNOSIS — R1031 Right lower quadrant pain: Secondary | ICD-10-CM

## 2022-08-21 DIAGNOSIS — S39011A Strain of muscle, fascia and tendon of abdomen, initial encounter: Secondary | ICD-10-CM | POA: Diagnosis not present

## 2022-08-21 NOTE — Patient Instructions (Signed)
Glad to hear your symptoms are slightly improved but with persistent discomfort and reassuring CT scan I will refer you to orthopedics to decide on other treatment, physical therapy or other testing.  Tylenol on occasion is fine for now.  Letter provided for restrictions but send form if your employer needs different documentation.  Hang in there!  Return to the clinic or go to the nearest emergency room if any of your symptoms worsen or new symptoms occur.

## 2022-08-21 NOTE — Progress Notes (Signed)
Subjective:  Patient ID: Kelly Tate, female    DOB: 1995/06/13  Age: 27 y.o. MRN: [id number]  CC:  Chief Complaint  Patient presents with   Abdominal Pain    Notes pain is not as intense but repetitive movements are still causing discomfort     HPI Kelly Tate presents for   Right lower quadrant abdominal pain See prior visits.  Initially seen May 16, thought to be possible abdominal wall strain at work, no definitive hernia appreciated at that time.  Plan for activity modification, symptomatic care with Tylenol, over-the-counter Advil as needed at that time.    Recheck May 23 - Similar symptoms at that time.  Pain with movement.  Cramping at times and abdomen if not going to urinate right away, resolved with urination.  Was still doing some lifting at that time with work, adjusted restrictions.  Hematuria noted on the urinalysis at that time but had been having intermittent spotting with recent start of Depo-Provera.  Thought to be due to vaginal bleeding.  Negative nitrite/LE at that time.  hCG negative.  Recheck May 30 -still felt about the same.  Some nausea without vomiting.  Had felt better off work for a week and less leaning forward, cramping, sore when she returned to work.  CT abdomen/pelvis ordered with continued restrictions. CT abdomen/pelvis performed June 6.  No acute intra-abdominal or pelvic finding.  Specifically no abdominal wall hernia or abnormality appreciated.  Here for follow-up today.  Pain has started to improve - not as intense. Nausea has resolved. Notices pain back at work with crouching, lifting up constantly (more than 2-3 times), or reaching overhead. Had workers' comp paperwork in mail - no restriction form.  Last at work 4 days ago, few days off last week - one day d/t soreness.  Still on light duty, but has to stand up to reach - that was sore.   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 Past Surgical History:  Procedure Laterality Date   LAPAROSCOPIC OVARIAN CYSTECTOMY Right 03/01/2015   Procedure: LAPAROSCOPIC OVARIAN CYSTECTOMY;  Surgeon: Jaymes Graff, MD;  Location: [facility];  Service: Gynecology;  Laterality: Right;   No Known Allergies Prior to Admission medications   Medication Sig Start Date End Date Taking? Authorizing Provider  medroxyPROGESTERone (DEPO-PROVERA) 150 MG/ML injection Inject 150 mg into the muscle every 3 (three) months.    [provider]   Social History   Socioeconomic History   Marital status: Single    Spouse name: Not on file   Number of children: Not on file   Years of education: Not on file   Highest education level: Not on file  Occupational History   Not on file  Tobacco Use   Smoking status: Never   Smokeless tobacco: Not on file  Vaping Use   Vaping Use: Never used  Substance and Sexual Activity   Alcohol use: Yes    Comment: occ   Drug use: Not Currently    Types: Marijuana    Comment: social   Sexual activity: Yes    Birth control/protection: None  Other Topics Concern   Not on file  Social History Narrative   Not on file  Social Determinants of Health   Financial Resource Strain: Not on file  Food Insecurity: Not on file  Transportation Needs: Not on file  Physical Activity: Not on file  Stress: Not on file  Social Connections: Not on file  Intimate Partner Violence: Not on file    Review of Systems   Objective:   Vitals:   08/21/22 1432  BP: 124/60  Pulse: 61  Temp: 97.6 F (36.4 C)  TempSrc: Temporal  SpO2: 99%  Weight: 165 lb 9.6 oz (75.1 kg)  Height: 5\' 11"  (1.803 m)     Physical Exam Constitutional:      General: She is not in acute distress.    Appearance: Normal  appearance. She is well-developed.  HENT:     Head: Normocephalic and atraumatic.  Cardiovascular:     Rate and Rhythm: Normal rate.  Pulmonary:     Effort: Pulmonary effort is normal.  Abdominal:     Tenderness: There is abdominal tenderness in the right lower quadrant. There is no right CVA tenderness, left CVA tenderness, guarding or rebound. Negative signs include Murphy's sign.  Neurological:     Mental Status: She is alert and oriented to person, place, and time.  Psychiatric:        Mood and Affect: Mood normal.      Assessment & Plan:  Kelly Tate is a 27 y.o. female . Right lower quadrant abdominal pain - Plan: Ambulatory referral to Orthopedic Surgery  Strain of abdominal wall, initial encounter - Plan: Ambulatory referral to Orthopedic Surgery Improving but persistent discomfort right lower quadrant, appears to be abdominal wall strain from injury at work.  CT abdomen pelvis reassuring.  Has used Tylenol on occasion with some relief, okay to continue same.  Some discomfort with certain activities at work as above, improved with rest.  Adjusted restrictions with handout given, can complete paperwork from work if needed but also referred to orthopedics to decide on physical therapy, other imaging or change in plan.  RTC precautions if new or worsening symptoms or unable to be seen by Ortho in the next 2 weeks.  No orders of the defined types were placed in this encounter.  Patient Instructions  Glad to hear your symptoms are slightly improved but with persistent discomfort and reassuring CT scan I will refer you to orthopedics to decide on other treatment, physical therapy or other testing.  Tylenol on occasion is fine for now.  Letter provided for restrictions but send form if your employer needs different documentation.  Hang in there!  Return to the clinic or go to the nearest emergency room if any of your symptoms worsen or new symptoms occur.     Signed,    Meredith Staggers, MD [facility], [facility] 08/21/22 3:18 PM

## 2022-08-25 ENCOUNTER — Ambulatory Visit: Payer: 59 | Admitting: Physician Assistant

## 2022-09-03 ENCOUNTER — Telehealth: Payer: Self-pay | Admitting: Registered Nurse

## 2022-09-03 NOTE — Telephone Encounter (Signed)
Caller name: Brad Falardeau  On DPR?: Yes  Call back number: [phone] (home)  Provider they see: Janeece Agee, NP  Reason for call:   Pt states the referral does not cover diagnosis below and that's all she would say.  R10.31 (ICD-10-CM) - Right lower quadrant abdominal pain S39.011A (ICD-10-CM) - Strain of abdominal wall, initial encounter

## 2022-09-03 NOTE — Telephone Encounter (Signed)
I have left the pt a VM to return my call in regards to her call

## 2022-09-05 NOTE — Telephone Encounter (Signed)
Patient called back and we discussed this, these dx are on the referral but [facility] does not cover these concerns, that is the issue.  Should patient be sent to GI for abdominal pain? Or should we try a different Ortho?

## 2022-09-05 NOTE — Telephone Encounter (Signed)
Called and LM again asking she call back so we can better understand

## 2022-09-08 NOTE — Telephone Encounter (Signed)
I sent a message to referrals to see if I need to speak with somebody at the orthopedic office.  It appears that they are seeing this as abdominal pain evaluation as opposed to abdominal wall pain or strain.  I am hoping to get a contact number for referral coordinator or somebody clinical to describe situation further and help her to be seen.  Waiting on response from referrals.  Thanks.

## 2022-09-18 ENCOUNTER — Ambulatory Visit (INDEPENDENT_AMBULATORY_CARE_PROVIDER_SITE_OTHER): Payer: 59 | Admitting: Family Medicine

## 2022-09-18 VITALS — BP 118/70 | HR 74 | Temp 98.5°F | Ht 71.0 in | Wt 169.4 lb

## 2022-09-18 DIAGNOSIS — R1031 Right lower quadrant pain: Secondary | ICD-10-CM

## 2022-09-18 DIAGNOSIS — S39011A Strain of muscle, fascia and tendon of abdomen, initial encounter: Secondary | ICD-10-CM

## 2022-09-18 NOTE — Progress Notes (Signed)
Subjective:  Patient ID: Kelly Tate, female    DOB: [birth year]  Age: 27 y.o. MRN: [id number]  CC:  Chief Complaint  Patient presents with   Abdominal Pain    Pt reports pain is about the same, nothing new at this time     HPI Kelly Tate presents for   Right lower quadrant abdominal pain See prior visits, initially seen May 16, thought to have possible abdominal wall strain at work.  No definitive hernia was appreciated.  Activity modification and symptomatic care with Tylenol, Advil initially.  Minimal change in symptoms on subsequent visits, did have some nausea at times, improved when she was off work for a week and less forward leaning.  Some cramping, soreness when returned to work.  She had a CT of her abdomen/pelvis on June 6 with no acute intra-abdominal or pelvic finding, specifically no abdominal wall hernia or abnormality appreciated.  Last visit June 20 with some improvement of pain, not as intense and nausea had resolved.  Did have some pain at work with crouching, or constant/frequent lifting, or reaching overhead.  Still  light duty at that time.  Still suspected abdominal wall strain but with persistent discomfort.  Continued Tylenol if needed, restrictions continued, and referred her to orthopedics, but referral not accepted for abdominal symptoms.   Feels about the same - similar soreness in R lower abdomen, toward groin, moves to R flank at times. Sore if lying on that side at night or resting arm on stomach. No further nausea, no vomiting, no hematuria. No leg radiation. Urinating ok, normal bowel movements.  Off work for 1 week, more sore when returned to work last week.  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 Stands instead of reaching. Avoiding reaching.  Tx: no recent meds. Prior tylenol. Heat at night, ice at times during day with activity.    History Patient Active Problem List   Diagnosis Date Noted   Chronic anal fissure  10/30/2021   Pain in pelvis 10/30/2021   Herpes simplex 09/25/2020   Osteopenia 09/25/2020   Vestibulitis, vulvar 02/02/2020   Endometriosis 05/17/2015   Cyst of right ovary 02/15/2015   No past medical history on file. Past Surgical History:  Procedure Laterality Date   LAPAROSCOPIC OVARIAN CYSTECTOMY Right 03/01/2015   Procedure: LAPAROSCOPIC OVARIAN CYSTECTOMY;  Surgeon: Jaymes Graff, MD;  Location: [facility];  Service: Gynecology;  Laterality: Right;   No Known Allergies Prior to Admission medications   Medication Sig Start Date End Date Taking? Authorizing Provider  medroxyPROGESTERone (DEPO-PROVERA) 150 MG/ML injection Inject 150 mg into the muscle every 3 (three) months.    [provider]   Social History   Socioeconomic History   Marital status: Single    Spouse name: Not on file   Number of children: Not on file   Years of education: Not on file   Highest education level: Not on file  Occupational History   Not on file  Tobacco Use   Smoking status: Never   Smokeless tobacco: Not on file  Vaping Use   Vaping status: Never Used  Substance and Sexual Activity   Alcohol use: Yes    Comment: occ   Drug use: Not Currently    Types: Marijuana    Comment: social   Sexual activity: Yes    Birth control/protection: None  Other Topics Concern   Not on file  Social History Narrative   Not on file   Social  Determinants of Health   Financial Resource Strain: Not on file  Food Insecurity: No Food Insecurity (04/11/2021)   Received from [facility]   Hunger Vital Sign    Worried About Running Out of Food in the Last Year: Never true    Ran Out of Food in the Last Year: Never true  Transportation Needs: Not on file  Physical Activity: Not on file  Stress: Not on file  Social Connections: Unknown (07/12/2021)   Received from [facility]   Social Network    Social Network: Not on file  Intimate Partner Violence: Unknown (06/07/2021)   Received from [facility]   HITS    Physically Hurt: Not on file    Insult or Talk Down To: Not on file    Threaten Physical Harm: Not on file    Scream or Curse: Not on file    Review of Systems Per HPI  Objective:   Vitals:   09/18/22 1540  BP: 118/70  Pulse: 74  Temp: 98.5 F (36.9 C)  TempSrc: Temporal  SpO2: 98%  Weight: 169 lb 6.4 oz (76.8 kg)  Height: 5\' 11"  (1.803 m)     Physical Exam Constitutional:      General: She is not in acute distress.    Appearance: Normal appearance. She is well-developed.  HENT:     Head: Normocephalic and atraumatic.  Cardiovascular:     Rate and Rhythm: Normal rate.  Pulmonary:     Effort: Pulmonary effort is normal.  Abdominal:    Neurological:     Mental Status: She is alert and oriented to person, place, and time.  Psychiatric:        Mood and Affect: Mood normal.        Assessment & Plan:  Martin Smeal is a 27 y.o. female . Strain of abdominal wall, initial encounter  Right lower quadrant abdominal pain Persistent pain in right lower quadrant after suspected abdominal wall strain, now 2 months from injury, minimal improvement.  Question of rectus abdominals tear or other muscular tear, differential includes "sports hernia" no apparent concerns on prior imaging with CT.  Will order MRI, continue activity modification and limitation of activities that cause pain.  Further evaluation versus specialty eval after imaging obtained.  No orders of the defined types were placed in this encounter.  Patient Instructions  No change in restrictions for now. Ok to continue heat or ice if that feels better and I will let you know about next step - possible ultrasound or MRI. Hang in there!  Return to the clinic or go to the nearest emergency room if any of your symptoms worsen or new symptoms occur.     Signed,   Meredith Staggers, MD [facility], [facility] 09/26/22 2:53 PM

## 2022-09-18 NOTE — Patient Instructions (Signed)
No change in restrictions for now. Ok to continue heat or ice if that feels better and I will let you know about next step - possible ultrasound or MRI. Hang in there!  Return to the clinic or go to the nearest emergency room if any of your symptoms worsen or new symptoms occur.

## 2022-09-26 ENCOUNTER — Encounter: Payer: Self-pay | Admitting: Family Medicine

## 2022-10-01 ENCOUNTER — Encounter (INDEPENDENT_AMBULATORY_CARE_PROVIDER_SITE_OTHER): Payer: Self-pay

## 2022-10-13 ENCOUNTER — Ambulatory Visit: Payer: 59 | Admitting: Family Medicine

## 2022-10-15 ENCOUNTER — Ambulatory Visit: Payer: 59 | Admitting: Family Medicine

## 2022-10-20 ENCOUNTER — Ambulatory Visit
Admission: RE | Admit: 2022-10-20 | Discharge: 2022-10-20 | Disposition: A | Discharge status: Home / Self Care | Payer: 59 | Source: Ambulatory Visit | Attending: Family Medicine | Admitting: Family Medicine

## 2022-10-20 DIAGNOSIS — R1031 Right lower quadrant pain: Secondary | ICD-10-CM

## 2022-10-20 DIAGNOSIS — S39011A Strain of muscle, fascia and tendon of abdomen, initial encounter: Secondary | ICD-10-CM

## 2022-10-20 MED ORDER — GADOPICLENOL 0.5 MMOL/ML IV SOLN
7.5000 mL | Freq: Once | INTRAVENOUS | Status: AC | PRN
  Administered 2022-10-20: 7 mL via INTRAVENOUS

## 2022-10-23 ENCOUNTER — Ambulatory Visit (INDEPENDENT_AMBULATORY_CARE_PROVIDER_SITE_OTHER): Payer: 59 | Admitting: Family Medicine

## 2022-10-23 ENCOUNTER — Encounter: Payer: Self-pay | Admitting: Family Medicine

## 2022-10-23 VITALS — BP 112/68 | HR 84 | Temp 100.9°F | Ht 71.0 in | Wt 169.0 lb

## 2022-10-23 DIAGNOSIS — R1031 Right lower quadrant pain: Secondary | ICD-10-CM

## 2022-10-23 DIAGNOSIS — S39011A Strain of muscle, fascia and tendon of abdomen, initial encounter: Secondary | ICD-10-CM

## 2022-10-23 DIAGNOSIS — U071 COVID-19: Secondary | ICD-10-CM

## 2022-10-23 DIAGNOSIS — R051 Acute cough: Secondary | ICD-10-CM | POA: Diagnosis not present

## 2022-10-23 LAB — POC COVID19 BINAXNOW: SARS Coronavirus 2 Ag: POSITIVE — AB

## 2022-10-23 NOTE — Progress Notes (Signed)
Subjective:  Patient ID: Kelly Tate, female    DOB: November 13, 1995  Age: 27 y.o. MRN: [id number]  CC:  Chief Complaint  Patient presents with   Follow-up    3 week, abd wall pain; pain is about the same, tender to touch. Has taken Tylenol, last dose yesterday.     HPI Kelly Tate presents for   Abdominal wall strain Last visit July 18.  Initially seen in May, thought to have abdominal wall strain at work, no definite hernia appreciated.  Symptomatic care initially along with work restrictions.  CT of abdomen pelvis on June 6 without intra-abdominal or pelvic finding, specifically no abdominal wall hernia appreciated.  We did attempt referral to orthopedics but referral was not accepted due to abdominal symptoms. Similar symptoms with right lower abdomen, groin and right flank discomfort at her July 18 visit.  No urinary or bowel symptoms at that time.  Was still on seated work with restrictions.  Continued symptomatic care and restrictions, and MRI was ordered to evaluate for possible sports hernia. That was completed yesterday, reading is still pending.  Out of work d/t mom's illness, who passed from breast cancer on 8/9. Family in town and has support system in place, including through her church.   Last at work on July 21st. Plans to return after labor day. Soreness still present if lying on area, sore in R groin with walking or prolonged standing. No new symptoms.   Cough, congestion Started yesterday. Slight sore throat yesterday, better today. Nephew visited form [place] last week and coughing - no covid testing.  No CP/dyspnea, eating and drinking ok.      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 No pertinent past medical  history. Past Surgical History:  Procedure Laterality Date   LAPAROSCOPIC OVARIAN CYSTECTOMY Right 03/01/2015   Procedure: LAPAROSCOPIC OVARIAN CYSTECTOMY;  Surgeon: Jaymes Graff, MD;  Location: [facility];  Service: Gynecology;  Laterality: Right;   No Known Allergies Prior to Admission medications   Medication Sig Start Date End Date Taking? Authorizing Provider  medroxyPROGESTERone (DEPO-PROVERA) 150 MG/ML injection Inject 150 mg into the muscle every 3 (three) months.   Yes [provider]   Social History   Socioeconomic History   Marital status: Single    Spouse name: Not on file   Number of children: Not on file   Years of education: Not on file   Highest education level: Not on file  Occupational History   Not on file  Tobacco Use   Smoking status: Never   Smokeless tobacco: Not on file  Vaping Use   Vaping status: Never Used  Substance and Sexual Activity   Alcohol use: Yes    Comment: occ   Drug use: Not Currently    Types: Marijuana    Comment: social   Sexual activity: Yes    Birth control/protection: None  Other Topics Concern   Not on file  Social History Narrative   Not on file   Social Determinants of Health   Financial Resource Strain: Not on file  Food Insecurity: No Food Insecurity (04/11/2021)   [facility]   Hunger Vital Sign    Worried About Running Out of Food in the Last Year:  Never true    Ran Out of Food in the Last Year: Never true  Transportation Needs: Not on file  Physical Activity: Not on file  Stress: Not on file  Social Connections: Unknown (07/12/2021)   [facility] from [facility]   Social Network    Social Network: Not on file  Intimate Partner Violence: Unknown (06/07/2021)   Received from [facility]   HITS    Physically Hurt: Not on file    Insult or Talk Down To: Not on file    Threaten Physical Harm: Not on file    Scream or Curse: Not on file    Review of Systems Per HPI  Objective:    Vitals:   10/23/22 1533  BP: 112/68  Pulse: 84  Temp: (!) 100.9 F (38.3 C)  TempSrc: Oral  SpO2: 96%  Weight: 169 lb (76.7 kg)  Height: 5\' 11"  (1.803 m)     Physical Exam Vitals reviewed.  Constitutional:      General: She is not in acute distress.    Appearance: She is well-developed.  HENT:     Head: Normocephalic and atraumatic.     Right Ear: Hearing, tympanic membrane, ear canal and external ear normal.     Left Ear: Hearing, tympanic membrane, ear canal and external ear normal.     Nose: Nose normal.     Mouth/Throat:     Pharynx: No posterior oropharyngeal erythema.  Eyes:     Conjunctiva/sclera: Conjunctivae normal.     Pupils: Pupils are equal, round, and reactive to light.  Cardiovascular:     Rate and Rhythm: Normal rate and regular rhythm.     Heart sounds: Normal heart sounds. No murmur heard. Pulmonary:     Effort: Pulmonary effort is normal. No respiratory distress.     Breath sounds: Normal breath sounds. No wheezing or rhonchi.  Abdominal:     Tenderness: There is abdominal tenderness (Right lower quadrant, without rebound.  Slight discomfort into the inguinal fold with internal rotation of right hip.).  Skin:    General: Skin is warm and dry.     Findings: No rash.  Neurological:     Mental Status: She is alert and oriented to person, place, and time.  Psychiatric:        Mood and Affect: Mood normal.        Behavior: Behavior normal.     Assessment & Plan:  Kelly Tate is a 27 y.o. female . Acute cough - Plan: POC COVID-19 BinaxNow COVID-19 virus infection  -COVID-19 infection, reassuring exam.  Outpatient treatment discussed with symptomatic care, ER/RTC precautions and isolation/masking precautions.  Right lower quadrant abdominal pain - Plan: Ambulatory referral to Sports Medicine Strain of abdominal wall, initial encounter - Plan: Ambulatory referral to Sports Medicine Right groin pain - Plan: Ambulatory referral to Sports  Medicine  -Persistent right abdominal pain, right groin pain, to evaluate for possible sports hernia.  Additionally have referred to sports medicine for possible ultrasound evaluation and discomfort and treatment options, including OMT if that would be appropriate.  Did discuss possible PT but would like to see results of MRI first.  Condolences given on the loss of her mother.  She has a good support system in place and denies any needs from [place] at this time.  Advised to let me know if any further assistance needed during this difficult time.   No orders of the defined types were placed in this encounter.  Patient Instructions  I  am so very sorry to hear about your loss.  Please let me know if we can help or if I can provide some resources during this difficult time.  I will watch for the results of the MRI, but I have also placed a referral to sports medicine provider that can perform ultrasound and for another opinion on your abdominal pain, groin pain.  Again the MRI may provide some information.  If any new or worsening symptoms, please be seen.      Signed,   Meredith Staggers, MD [facility], [facility] 10/23/22 4:38 PM

## 2022-10-23 NOTE — Patient Instructions (Addendum)
I am so very sorry to hear about your loss.  Please let me know if we can help or if I can provide some resources during this difficult time.  Unfortunately your test for COVID was positive today.  Today would be day 1 of symptoms.  I do recommend Mucinex as needed for cough, Tylenol or Advil if needed for body aches, headache or fever.  Make sure to drink plenty of fluids and rest.  I do recommend isolation from others for the first 5 days of illness when you are most contagious, then after 5 days as long as you are not having fevers off of fever reducing medicines and your symptoms are improving you are considered less contagious.  Could mask around others inside for an additional 5 days to be cautious.  If any chest pains, shortness of breath especially at rest, confusion or difficulty drinking fluids, be seen through the emergency room or urgent care but I do not expect that to occur.  Hang in there.  I will watch for the results of the MRI, but I have also placed a referral to sports medicine provider that can perform ultrasound and for another opinion on your abdominal pain, groin pain.  Again the MRI may provide some information.  If any new or worsening symptoms, please be seen.

## 2022-10-28 ENCOUNTER — Ambulatory Visit (INDEPENDENT_AMBULATORY_CARE_PROVIDER_SITE_OTHER): Payer: 59 | Admitting: Family Medicine

## 2022-10-28 ENCOUNTER — Telehealth: Payer: Self-pay

## 2022-10-28 ENCOUNTER — Encounter: Payer: Self-pay | Admitting: Family Medicine

## 2022-10-28 VITALS — BP 104/72 | HR 101 | Ht 71.0 in | Wt 163.0 lb

## 2022-10-28 DIAGNOSIS — R1011 Right upper quadrant pain: Secondary | ICD-10-CM | POA: Diagnosis not present

## 2022-10-28 DIAGNOSIS — M25551 Pain in right hip: Secondary | ICD-10-CM

## 2022-10-28 NOTE — Patient Instructions (Signed)
Thank you for coming in today.   I've referred you to Physical Therapy.  Let us know if you don't hear from them in one week.   Recheck in 6 weeks.

## 2022-10-28 NOTE — Progress Notes (Unsigned)
I, Stevenson Clinch, CMA acting as a scribe for [name], MD.   Kelly Tate is a 27 y.o. female who presents to [facility] at [facility] today for abdominal pain ongoing since May 2024. MOI: Pain started after trying to lift something heavy at work. Pt locates pain to right side lower abd/pelv radiating into the groin. Sx worse when sitting from a laying position, laying on the right side and walking for > 5 min. Has tried Tylenol, Advil, IBU with short-term relief. Tried heat and ice initially.   Dx testing: 10/20/22 Abdomen MRI  08/07/22 Ab/Pelvis CT   Pertinent review of systems: No fevers or chills  Relevant historical information: Osteopenia.    Exam:  BP 104/72   Pulse (!) 101   Ht 5\' 11"  (1.803 m)   Wt 163 lb (73.9 kg)   SpO2 100%   BMI 22.73 kg/m  General: Well Developed, well nourished, and in no acute distress.   MSK: Right abdominal wall is nontender.  Right hip normal motion and strength.    Lab and Radiology Results  MR Abdomen W Wo Contrast  Result Date: 10/25/2022 CLINICAL DATA:  Right lower quadrant abdominal pain, suspect injury/abdominal wall strain with persistent pain. Assess for tear of rectus musculature. Assess for hernia. EXAM: MRI ABDOMEN WITHOUT AND WITH CONTRAST TECHNIQUE: Multiplanar multisequence MR imaging of the abdomen was performed both before and after the administration of intravenous contrast. CONTRAST:  7 cc of [place] COMPARISON:  CT August 07, 2022 FINDINGS: Lower chest: No acute abnormality. Hepatobiliary: No significant hepatic steatosis. No suspicious hepatic lesion. Gallbladder is unremarkable. No biliary ductal dilation. Pancreas: No pancreatic ductal dilation or evidence of acute inflammation. Spleen:  No splenomegaly or focal splenic lesion. Adrenals/Urinary Tract: No masses identified. No evidence of hydronephrosis. Stomach/Bowel: Visualized portions within the abdomen are unremarkable. Vascular/Lymphatic: No  pathologically enlarged lymph nodes identified. No abdominal aortic aneurysm demonstrated. Other:  No abdominal wall mass or hernia identified. Musculoskeletal: No evidence of abdominal wall muscle injury. No acute osseous abnormality. IMPRESSION: 1. No acute abnormality in the abdomen. 2. No evidence of abdominal wall muscle injury, mass or hernia. Consider dedicated ultrasound at patient's palpable area of concern if patient's symptoms persist. Electronically Signed   By: Maudry Mayhew M.D.   On: 10/25/2022 11:12   CT Abdomen Pelvis Wo Contrast  Result Date: 08/09/2022 CLINICAL DATA:  Right lower quadrant abdominopelvic pain into the right inguinal area. EXAM: CT ABDOMEN AND PELVIS WITHOUT CONTRAST TECHNIQUE: Multidetector CT imaging of the abdomen and pelvis was performed following the standard protocol without IV contrast. RADIATION DOSE REDUCTION: This exam was performed according to the departmental dose-optimization program which includes automated exposure control, adjustment of the mA and/or kV according to patient size and/or use of iterative reconstruction technique. COMPARISON:  None Available. FINDINGS: Lower chest: Anterior chest mild pectus excavatum deformity. Normal heart size. No pericardial or pleural effusion. Clear lung bases. Hepatobiliary: Limited without IV contrast. No large focal hepatic abnormality. No biliary dilatation or obstruction pattern. Gallbladder unremarkable. Common bile duct nondilated. Pancreas: Unremarkable. No pancreatic ductal dilatation or surrounding inflammatory changes. Spleen: Normal in size without focal abnormality. Adrenals/Urinary Tract: Adrenal glands are unremarkable. Kidneys are normal, without renal calculi, focal lesion, or hydronephrosis. Bladder is unremarkable. Stomach/Bowel: Stomach is within normal limits. Appendix appears normal. No evidence of bowel wall thickening, distention, or inflammatory changes. Vascular/Lymphatic: Limited without IV  contrast. Intact aorta. No aneurysm. No retroperitoneal hemorrhage or hematoma. No bulky adenopathy. Reproductive:  Uterus and bilateral adnexa are unremarkable. Other: No abdominal wall hernia or abnormality. No abdominopelvic ascites. Musculoskeletal: No acute or significant osseous findings. IMPRESSION: 1. No acute intra-abdominal or pelvic finding by noncontrast CT. 2. Mild pectus excavatum deformity. Electronically Signed   By: Judie Petit.  Shick M.D.   On: 08/09/2022 09:40   I, [name], personally (independently) visualized and performed the interpretation of the images attached in this note.      Assessment and Plan: 27 y.o. female with right abdominal pain and right groin pain.  Etiology is unclear.  At this point she is in my clinic to evaluate for sports hernia which is a possibility.  Her symptoms are not entirely consistent with sports hernia.  The MRI test that she had recently is partially accurate for sports hernia as it only includes the abdomen but not the pelvis.  Plan for trial of physical therapy.  If not improved would likely consider MRI arthrogram of the hip with special additional views to evaluate for sports hernia.  Recheck in about 6 weeks.   PDMP not reviewed this encounter. Orders Placed This Encounter  Procedures   Ambulatory referral to Physical Therapy    Referral Priority:   Routine    Referral Type:   Physical Medicine    Referral Reason:   Specialty Services Required    Requested Specialty:   Physical Therapy    Number of Visits Requested:   1   No orders of the defined types were placed in this encounter.    Discussed warning signs or symptoms. Please see discharge instructions. Patient expresses understanding.   The above documentation has been reviewed and is accurate and complete [name], M.D.

## 2022-10-28 NOTE — Telephone Encounter (Signed)
-----   Message from Shade Flood sent at 10/28/2022 10:22 AM EDT ----- Call patient, check status.  I know that she had recently been diagnosed with COVID infection last visit.  She should be improving.  Also let her know that her MRI did not show any acute abnormalities, or any concerns with the muscles, no sign of mass or hernia.  However I think it would be still helpful to follow-up with the other sports medicine specialist for possible ultrasound as that may help evaluate symptoms further.  Keep that follow-up as planned but please let me know if there are questions.

## 2022-10-29 NOTE — Telephone Encounter (Signed)
 Pt has reviewed via mychart

## 2022-11-04 ENCOUNTER — Telehealth: Payer: Self-pay

## 2022-11-04 NOTE — Telephone Encounter (Signed)
Caller name: Darsi Ferrucci  On DPR?: Yes  Call back number: [phone] (mobile)  Provider they see: Pcp, No  Reason for call:   Pt returns to work tomorrow and is asking restrictions and a return to work note.

## 2022-11-04 NOTE — Telephone Encounter (Signed)
Pt called and she states she seen DR Neva Seat on 10/23/22 for a f/u on abdominal pain just so happened she was positive COVID that day . She states she has been on restrictions for work since July for the pain .  She is suppose to return to work tomorrow and wanted to know if she is still suppose to be on the restrictions and if so she needs a note ? She is going back tomorrow from being on Bereavement for losing her mother

## 2022-11-04 NOTE — Therapy (Signed)
OUTPATIENT PHYSICAL THERAPY LOWER EXTREMITY EVALUATION   Patient Name: Kelly Tate MRN: [id number] DOB:[birth year], 27 y.o., female Today's Date: 11/05/2022  END OF SESSION:  PT End of Session - 11/05/22 1407     Visit Number 1    Date for PT Re-Evaluation 12/17/22    Authorization Type [employer]    PT Start Time 1407    PT Stop Time 1450    PT Time Calculation (min) 43 min    Activity Tolerance Patient tolerated treatment well    Behavior During Therapy WFL for tasks assessed/performed             History reviewed. No pertinent past medical history. 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   Pain in pelvis 10/30/2021   Herpes simplex 09/25/2020   Osteopenia 09/25/2020   Vestibulitis, vulvar 02/02/2020   Endometriosis 05/17/2015   Cyst of right ovary 02/15/2015    PCP: Pcp, No   REFERRING PROVIDER: Rodolph Bong, MD   REFERRING DIAG:  [id number] (ICD-10-CM) - Hip pain, right  R10.11 (ICD-10-CM) - Abdominal pain, right upper quadrant    THERAPY DIAG:  Pain in right hip  Cramp and spasm  Rationale for Evaluation and Treatment: Rehabilitation  ONSET DATE: May 2024  SUBJECTIVE:   SUBJECTIVE STATEMENT: Patient was lifting something at work (aound 20#) and felt a pull in her right abdomen and hip. Scans are negative for hernia. It used to be whenever she walked it bothered her, but now it comes on later. She felt it walking in from parking lot. Bending over repetitively hurts. Cannot use stairs, takes elevator.   PERTINENT HISTORY: 2016 had ovarian cyst removed, Possible sports hernia PAIN:  Are you having pain? Yes: NPRS scale: 4 today up to 8/10 Pain location: R groin Pain  description: schocking Aggravating factors: lying on R side, walking Relieving factors: sitting slouched  PRECAUTIONS: None  RED FLAGS: None   WEIGHT BEARING RESTRICTIONS: No  FALLS:  Has patient fallen in last 6 months? 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 On light duty right now.  PLOF: Independent Can't work out normally  PATIENT GOALS: figure it out and get rid of pain  NEXT MD VISIT: none scheduled  OBJECTIVE:   DIAGNOSTIC FINDINGS:  CT and MRI negative for hernia  PATIENT SURVEYS:  FOTO 59 (goal 83)  COGNITION: Overall cognitive status: Within functional limits for tasks assessed     SENSATION: WFL  MUSCLE LENGTH: Tight HS bil, pain with prone knee flex R to 90 deg  POSTURE: increased lumbar lordosis  PALPATION: Even pelvis, sacrum, no tenderness in obliques TTP Lumbar with PA mobs most at L1/2 R and L5/S1 R; tender at R lumbar and QL, iliacus  LUMBAR ROM: L SB poor quality of movement and pulls in R obliques, B rotation 75% pulls to L>R, else WNL but pulls with extension also  LOWER EXTREMITY ROM: * pain starts at 95  Active ROM Right eval Left eval  Hip flexion 100* 120  Hip extension  Hip abduction    Hip adduction    Hip internal rotation    Hip external rotation    Knee flexion    Knee extension    Ankle dorsiflexion    Ankle plantarflexion    Ankle inversion    Ankle eversion     (Blank rows = not tested)  LOWER EXTREMITY MMT: *pain  MMT Right eval Left eval  Hip flexion 5* 5  Hip extension 4+* 5  Hip abduction 5   Hip adduction 5   Hip internal rotation    Hip external rotation    Knee flexion 5 5  Knee extension    Ankle dorsiflexion    Ankle plantarflexion    Ankle inversion    Ankle eversion      (Blank rows = not tested)  LOWER EXTREMITY SPECIAL TESTS:  Hip special tests: Ely's test: positive    GAIT: Distance walked: 20 Assistive device utilized: None Level of assistance: Complete Independence Comments: WNL today   TODAY'S TREATMENT:                                                                                                                              DATE:   11/05/22 See pt ed and HEP   PATIENT EDUCATION:  Education details: PT eval findings, anticipated POC, initial HEP, and role of DN  Person educated: Patient Education method: Explanation, Demonstration, and Handouts Education comprehension: verbalized understanding and returned demonstration  HOME EXERCISE PROGRAM: Access Code: [email or web address] URL: [email or web address] Date: 11/05/2022 Prepared by: Raynelle Fanning  Exercises - Supine Transversus Abdominis Bracing - Hands on Stomach  - 1 x daily - 7 x weekly - 1 sets - 10 reps - 5 sec hold - Seated Transversus Abdominis Bracing  - 1 x daily - 3-4 x weekly - 1 sets - 10 reps - Standing Lumbar Extension at Wall - Forearms  - 4-5 x daily - 7 x weekly - 2 sets - 10 reps - 2-5 sec hold  ASSESSMENT:  CLINICAL IMPRESSION: Patient is a 27 y.o. female who was seen today for physical therapy evaluation and treatment for R hip and abdominal pain. She has marked pain in her R Illiacus, glueals and lumbar affecting her hip and back ROM and limiting functional activities including walking, stairs, sitting with good posture and lying on her R side. She is presently on light duty at work. Signs and symptoms indicate a likely strain of her Iliopsoas. She initially had low back pain but it resolved and now is isolated more to the anterior hip and groin. She will benefit from skilled PT to address these deficits.    OBJECTIVE IMPAIRMENTS: decreased activity tolerance, difficulty walking, decreased ROM, decreased strength, hypomobility, increased muscle spasms, impaired  flexibility, postural dysfunction, and pain.   ACTIVITY LIMITATIONS: carrying, lifting, sitting, sleeping, stairs, bed mobility, and locomotion level  PARTICIPATION LIMITATIONS: cleaning, community activity, and occupation  PERSONAL FACTORS: Profession are also affecting patient's functional outcome.  REHAB POTENTIAL: Excellent  CLINICAL DECISION MAKING: Stable/uncomplicated  EVALUATION COMPLEXITY: Low   GOALS: Goals reviewed with patient? Yes  SHORT TERM GOALS: Target date: 11/19/2022  Patient will be independent with initial HEP. Baseline:  Goal status: INITIAL   LONG TERM GOALS: Target date: 12/17/2022  Patient will be independent with advanced/ongoing HEP to improve outcomes and carryover.  Baseline:  Goal status: INITIAL  2.  Patient will report at least 85% improvement in R hip pain with ADLs to improve QOL. Baseline:  Goal status: INITIAL  3.  Patient will be able to sit and sleep without pain. Baseline:  Goal status: INITIAL  4.  Patient will be able to ambulate normal community distances without increased hip pain. Baseline:  Goal status: INITIAL  5.  Patient will report 44 on FOTO to demonstrate improved functional ability. Baseline: 59 Goal status: INITIAL  6.  Patient will be able to climb stairs with a reciprocal gait and no increase in hip pain. Baseline:  Goal status: INITIAL   7.  Patient able to lift 20# with good body mechanics and no exacerbation of pain to allow RTW. Baseline:  Goal status: INITIAL     PLAN:  PT FREQUENCY: 2x/week  PT DURATION: 6 weeks  PLANNED INTERVENTIONS: Therapeutic exercises, Therapeutic activity, Neuromuscular re-education, Balance training, Gait training, Patient/Family education, Self Care, Joint mobilization, Stair training, Aquatic Therapy, Dry Needling, Electrical stimulation, Spinal mobilization, Cryotherapy, Moist heat, Ultrasound, and Manual therapy  PLAN FOR NEXT SESSION: Review and progress HEP, DN  to  R illiacus, psoas, lumbar, progress to eccentric hip flexor strengthening, core and lumbar stab.    Solon Palm, PT  11/05/2022, 5:18 PM

## 2022-11-05 ENCOUNTER — Ambulatory Visit: Payer: 59 | Attending: Family Medicine | Admitting: Physical Therapy

## 2022-11-05 ENCOUNTER — Other Ambulatory Visit: Payer: Self-pay

## 2022-11-05 ENCOUNTER — Encounter: Payer: Self-pay | Admitting: Physical Therapy

## 2022-11-05 DIAGNOSIS — R1011 Right upper quadrant pain: Secondary | ICD-10-CM | POA: Insufficient documentation

## 2022-11-05 DIAGNOSIS — M25551 Pain in right hip: Secondary | ICD-10-CM | POA: Insufficient documentation

## 2022-11-05 DIAGNOSIS — R252 Cramp and spasm: Secondary | ICD-10-CM | POA: Insufficient documentation

## 2022-11-05 NOTE — Telephone Encounter (Signed)
She was referred to sports medicine and had a visit with Dr. Denyse Amass recently.  Plan for follow-up in 6 weeks, and was referred for physical therapy.  I am okay extending her restrictions that were previously provided until that follow-up with sports medicine, Dr. Denyse Amass.

## 2022-11-06 ENCOUNTER — Telehealth: Payer: Self-pay

## 2022-11-06 NOTE — Telephone Encounter (Signed)
Caller name: Donyel Gillentine  On DPR?: Yes  Call back number: [phone] (mobile)  Provider they see: Pcp, No  Reason for call:  Pt was returning Mackenzie's call regarding a VM that was left. She asked for a return call

## 2022-11-06 NOTE — Telephone Encounter (Signed)
Called and LM to call back so we can discuss this

## 2022-11-07 ENCOUNTER — Encounter: Payer: Self-pay | Admitting: Family Medicine

## 2022-11-07 NOTE — Telephone Encounter (Signed)
Pt expressed understanding when we spoke this morning, needs a letter on our letter head stating her restrictions and then she will have Dr Denyse Amass do an updated one once she sees him later on. States she can pull it from MyChart and just needs to know when it is ready

## 2022-11-12 ENCOUNTER — Ambulatory Visit: Payer: 59

## 2022-11-12 ENCOUNTER — Telehealth: Payer: Self-pay

## 2022-11-12 DIAGNOSIS — M25551 Pain in right hip: Secondary | ICD-10-CM | POA: Diagnosis not present

## 2022-11-12 DIAGNOSIS — R252 Cramp and spasm: Secondary | ICD-10-CM

## 2022-11-12 NOTE — Therapy (Addendum)
 OUTPATIENT PHYSICAL THERAPY LOWER EXTREMITY TREATMENT AND DISCHARGE SUMMARY   Patient Name: Allis Quirarte MRN: [id number] DOB:1995/09/11, 27 y.o., female Today's Date: 11/12/2022  END OF SESSION:  PT End of Session - 11/12/22 1447     Visit Number 2    Date for PT Re-Evaluation 12/17/22    Authorization Type [employer]    Authorization Time Period 60 VISITS COMBINED WITH OT    PT Start Time 1448    PT Stop Time 1526    PT Time Calculation (min) 38 min    Activity Tolerance Patient tolerated treatment well    Behavior During Therapy WFL for tasks assessed/performed             History reviewed. No pertinent past medical history. 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   Pain in pelvis 10/30/2021   Herpes simplex 09/25/2020   Osteopenia 09/25/2020   Vestibulitis, vulvar 02/02/2020   Endometriosis 05/17/2015   Cyst of right ovary 02/15/2015    PCP: Pcp, No   REFERRING PROVIDER: Rodolph Bong, MD   REFERRING DIAG:  [phone] (ICD-10-CM) - Hip pain, right  R10.11 (ICD-10-CM) - Abdominal pain, right upper quadrant    THERAPY DIAG:  Pain in right hip  Cramp and spasm  Rationale for Evaluation and Treatment: Rehabilitation  ONSET DATE: May 2024  SUBJECTIVE:   SUBJECTIVE STATEMENT: Patient reports no significant changes in symptoms since last visit; states her low back bothered her after last visit from the testing at PT eval.  PERTINENT HISTORY: 2016 had ovarian cyst removed, Possible sports hernia PAIN:  Are you having pain? Yes: NPRS scale: 4 today up to 8/10 Pain location: R groin Pain description: schocking Aggravating factors: lying on R side, walking Relieving factors:  sitting slouched  PRECAUTIONS: None  RED FLAGS: None   WEIGHT BEARING RESTRICTIONS: No  FALLS:  Has patient fallen in last 6 months? 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 On light duty right now.  PLOF: Independent Can't work out normally  PATIENT GOALS: figure it out and get rid of pain  NEXT MD VISIT: none scheduled  OBJECTIVE:   DIAGNOSTIC FINDINGS:  CT and MRI negative for hernia  PATIENT SURVEYS:  FOTO 59 (goal 61)  COGNITION: Overall cognitive status: Within functional limits for tasks assessed     SENSATION: WFL  MUSCLE LENGTH: Tight HS bil, pain with prone knee flex R to 90 deg  POSTURE: increased lumbar lordosis  PALPATION: Even pelvis, sacrum, no tenderness in obliques TTP Lumbar with PA mobs most at L1/2 R and L5/S1 R; tender at R lumbar and QL, iliacus  LUMBAR ROM: L SB poor quality of movement and pulls in R obliques, B rotation 75% pulls to L>R, else WNL but pulls with extension also  LOWER EXTREMITY ROM: * pain starts at 95  Active ROM Right eval Left eval  Hip flexion 100* 120  Hip extension    Hip abduction    Hip adduction    Hip internal rotation    Hip external  rotation    Knee flexion    Knee extension    Ankle dorsiflexion    Ankle plantarflexion    Ankle inversion    Ankle eversion     (Blank rows = not tested)  LOWER EXTREMITY MMT: *pain  MMT Right eval Left eval  Hip flexion 5* 5  Hip extension 4+* 5  Hip abduction 5   Hip adduction 5   Hip internal rotation    Hip external rotation    Knee flexion 5 5  Knee extension    Ankle dorsiflexion    Ankle plantarflexion    Ankle inversion    Ankle eversion     (Blank rows = not tested)  LOWER EXTREMITY SPECIAL TESTS:  Hip special tests: Ely's  test: positive    GAIT: Distance walked: 20 Assistive device utilized: None Level of assistance: Complete Independence Comments: WNL today   TODAY'S TREATMENT:                                                                                                                              [facility] Adult PT Treatment:                                                DATE: 11/12/2022 Therapeutic Exercise: Hooklying abdominal bracing --> added RTB for tactile feedback  Hooklying knee lifts + breathing cues Myofascial release R psoas: coregous ball --> small myofascial ball (too tender) Seated diaphragmatic breathing with RTB STS with focus on hip hinge mechanics Seated deadlift 15#KB Standing mini wall squat + ball b/ knees (discontinued d/t R lumbar pinching pain) Standing posterior pelvic tilt (discontinued d/t R lumbar pinching pain) Hooklying pelvic floor activation R thomas stretch (stretch --> pinching pain over time) Seated R hip flexor stretch (pain)   DATE:   11/05/22 See pt ed and HEP   PATIENT EDUCATION:  Education details: PT eval findings, anticipated POC, initial HEP, and role of DN  Person educated: Patient Education method: Explanation, Demonstration, and Handouts Education comprehension: verbalized understanding and returned demonstration  HOME EXERCISE PROGRAM: Access Code: [email or web address] URL: [email or web address] Date: 11/05/2022 Prepared by: Raynelle Fanning  Exercises - Supine Transversus Abdominis Bracing - Hands on Stomach  - 1 x daily - 7 x weekly - 1 sets - 10 reps - 5 sec hold - Seated Transversus Abdominis Bracing  - 1 x daily - 3-4 x weekly - 1 sets - 10 reps - Standing Lumbar Extension at Wall - Forearms  - 4-5 x daily - 7 x weekly - 2 sets - 10 reps - 2-5 sec hold  ASSESSMENT:  CLINICAL IMPRESSION: Abdominal bracing techniques and pelvic floor strengthening exercises. Patient reported anterior hip pain during eccentric phase of knee lift in hooklying and  "pinching" pain on R side of lumbar spine during standing posterior pelvic tilts  and gentle hip flexor stretch.   EVAL: Patient is a 27 y.o. female who was seen today for physical therapy evaluation and treatment for R hip and abdominal pain. She has marked pain in her R Illiacus, gluteals and lumbar affecting her hip and back ROM and limiting functional activities including walking, stairs, sitting with good posture and lying on her R side. She is presently on light duty at work. Signs and symptoms indicate a likely strain of her Iliopsoas. She initially had low back pain but it resolved and now is isolated more to the anterior hip and groin. She will benefit from skilled PT to address these deficits.    OBJECTIVE IMPAIRMENTS: decreased activity tolerance, difficulty walking, decreased ROM, decreased strength, hypomobility, increased muscle spasms, impaired flexibility, postural dysfunction, and pain.   ACTIVITY LIMITATIONS: carrying, lifting, sitting, sleeping, stairs, bed mobility, and locomotion level  PARTICIPATION LIMITATIONS: cleaning, community activity, and occupation  PERSONAL FACTORS: Profession are also affecting patient's functional outcome.   REHAB POTENTIAL: Excellent  CLINICAL DECISION MAKING: Stable/uncomplicated  EVALUATION COMPLEXITY: Low   GOALS: Goals reviewed with patient? Yes  SHORT TERM GOALS: Target date: 11/19/2022  Patient will be independent with initial HEP. Baseline:  Goal status: INITIAL   LONG TERM GOALS: Target date: 12/17/2022  Patient will be independent with advanced/ongoing HEP to improve outcomes and carryover.  Baseline:  Goal status: INITIAL  2.  Patient will report at least 85% improvement in R hip pain with ADLs to improve QOL. Baseline:  Goal status: INITIAL  3.  Patient will be able to sit and sleep without pain. Baseline:  Goal status: INITIAL  4.  Patient will be able to ambulate normal community distances without increased hip  pain. Baseline:  Goal status: INITIAL  5.  Patient will report 59 on FOTO to demonstrate improved functional ability. Baseline: 59 Goal status: INITIAL  6.  Patient will be able to climb stairs with a reciprocal gait and no increase in hip pain. Baseline:  Goal status: INITIAL   7.  Patient able to lift 20# with good body mechanics and no exacerbation of pain to allow RTW. Baseline:  Goal status: INITIAL    PLAN:  PT FREQUENCY: 2x/week  PT DURATION: 6 weeks  PLANNED INTERVENTIONS: Therapeutic exercises, Therapeutic activity, Neuromuscular re-education, Balance training, Gait training, Patient/Family education, Self Care, Joint mobilization, Stair training, Aquatic Therapy, Dry Needling, Electrical stimulation, Spinal mobilization, Cryotherapy, Moist heat, Ultrasound, and Manual therapy  PLAN FOR NEXT SESSION: Progress HEP, DN to  R illiacus, psoas, lumbar, progress to eccentric hip flexor strengthening, core and lumbar stab.    Carlynn Herald, PTA  11/12/2022, 3:36 PM  PHYSICAL THERAPY DISCHARGE SUMMARY  Visits from Start of Care: 2  Current functional level related to goals / functional outcomes: Unknown as did not return for f/u visits.   Remaining deficits: Unknown as did not return for f/u visits.   Education / Equipment: HEP   Patient agrees to discharge. Patient goals were not met. Patient is being discharged due to not returning since the last visit.  Solon Palm, PT 05/07/23 12:41 PM  [facility] at [facility] [address] [place], [place] [address]  [phone] (office) [phone] (fax)

## 2022-11-12 NOTE — Telephone Encounter (Signed)
Caller name: Tanaia Thissell  On DPR?: Yes  Call back number: [phone] (mobile)  Provider they see: Pcp, No  Reason for call:  Pt was referred to Dr. Kandee Keen but his office does not work with worker's compensation. She is requesting to be referred to another office so that she can file worker's compensation since she was injured at work.

## 2022-11-13 ENCOUNTER — Ambulatory Visit: Payer: 59

## 2022-11-13 NOTE — Telephone Encounter (Signed)
I have completed paperwork for her employer in the past but if this is through [employer] I know that is something that needs to be discussed with her employer as they may have a specific office/provider that she needs to see.  Please have her contact her employer or HR department through work to assist in that evaluation.

## 2022-11-13 NOTE — Telephone Encounter (Signed)
Spoke to the pt she states that neither Dr. Rosalita Chessman office or the outpt rehab do [facility] claims. I told her the only one I know of if [facility]. They declined the referral that Dr. Chilton Si had placed before.   Pt states she did call herself and make an appt on 9/27 for [facility].  She also has reached out to her union and they told her she could use anyone provider she wanted to use.  I let her know to let us know if [facility] can't help her and we will see if Dr. Neva Seat can re-eval    Kelly Tate

## 2022-11-19 ENCOUNTER — Ambulatory Visit: Payer: 59

## 2022-11-20 ENCOUNTER — Ambulatory Visit: Payer: 59

## 2023-02-27 IMAGING — DX DG WRIST COMPLETE 3+V*L*
3 series · 3 of 3 positions shown · non-contrast
Comparison: None.

CLINICAL DATA: Two months of radial wrist pain.

EXAM:
LEFT WRIST - COMPLETE 3+ VIEW

[wrist pa]
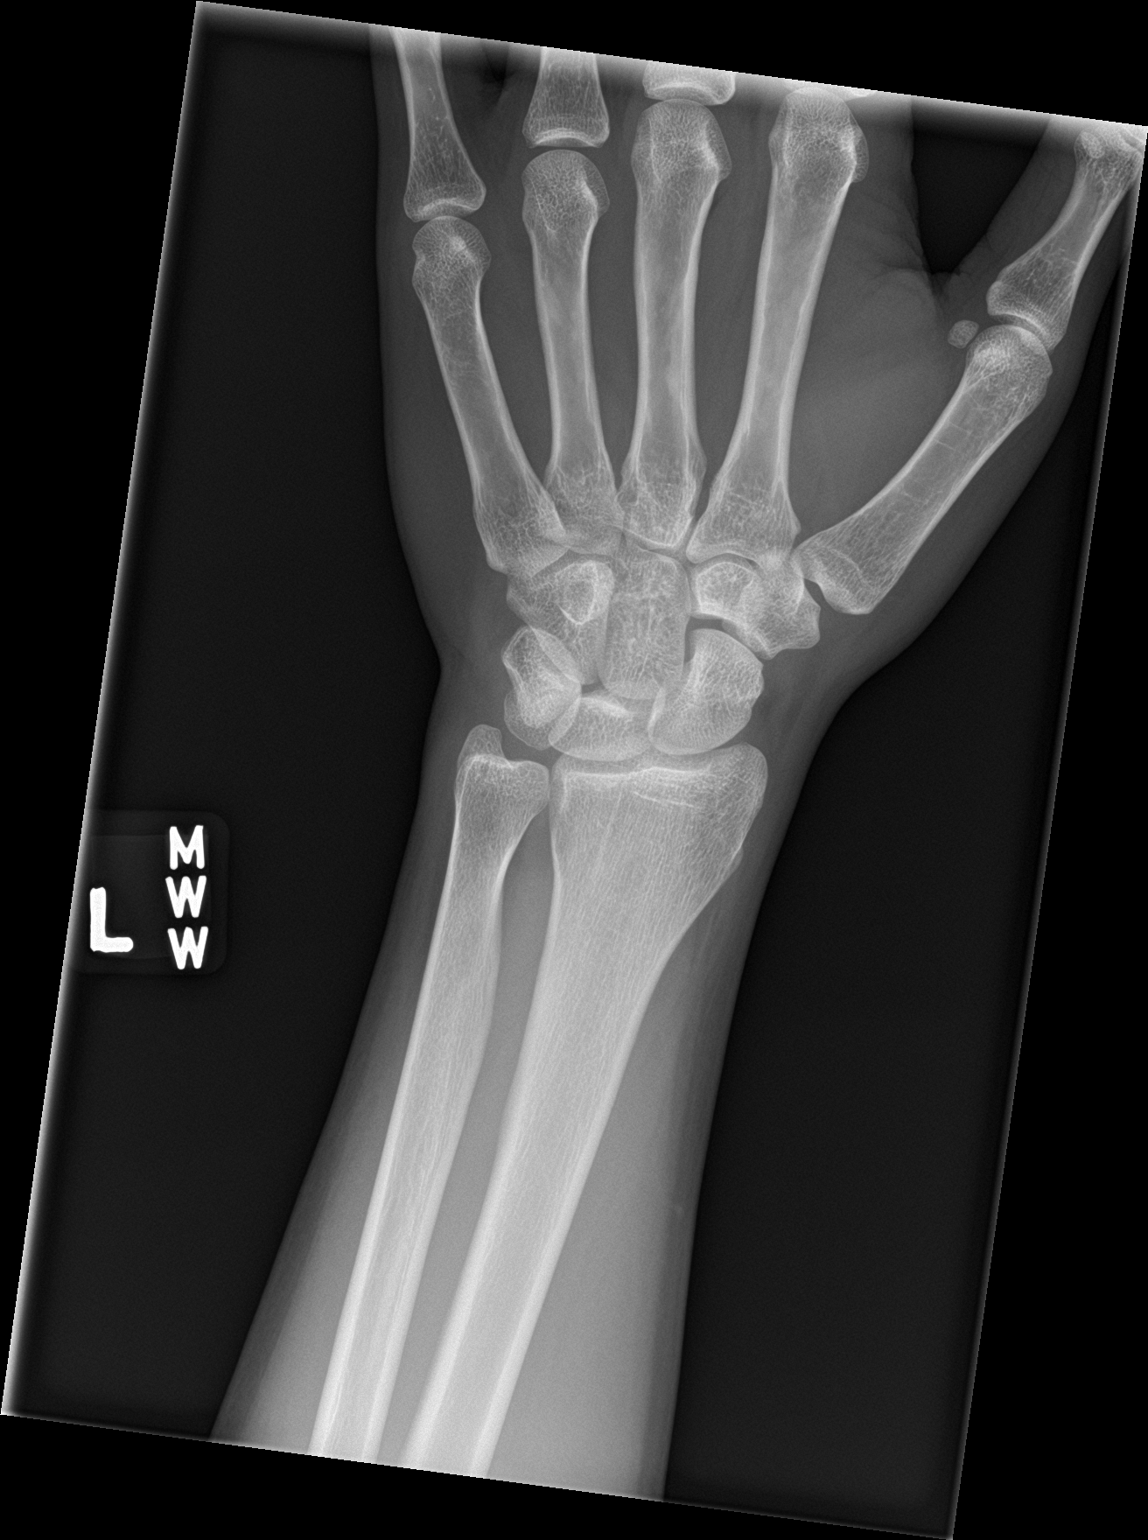

[wrist navicular]
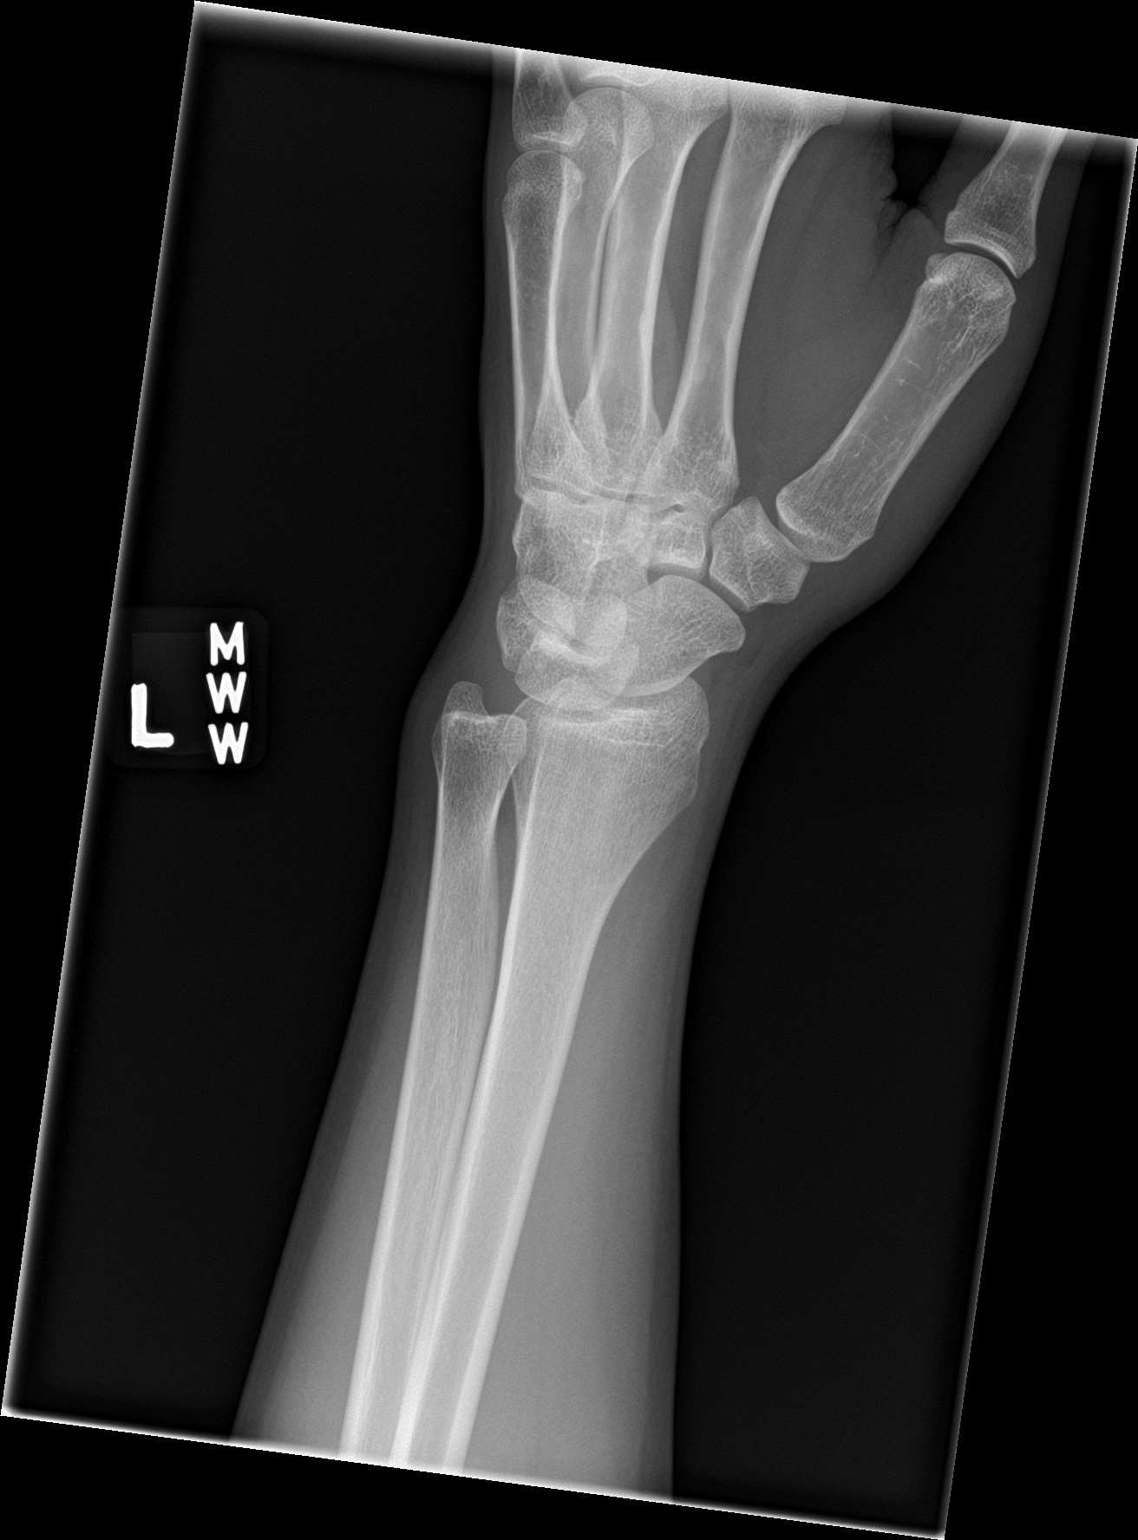

[wrist lat]
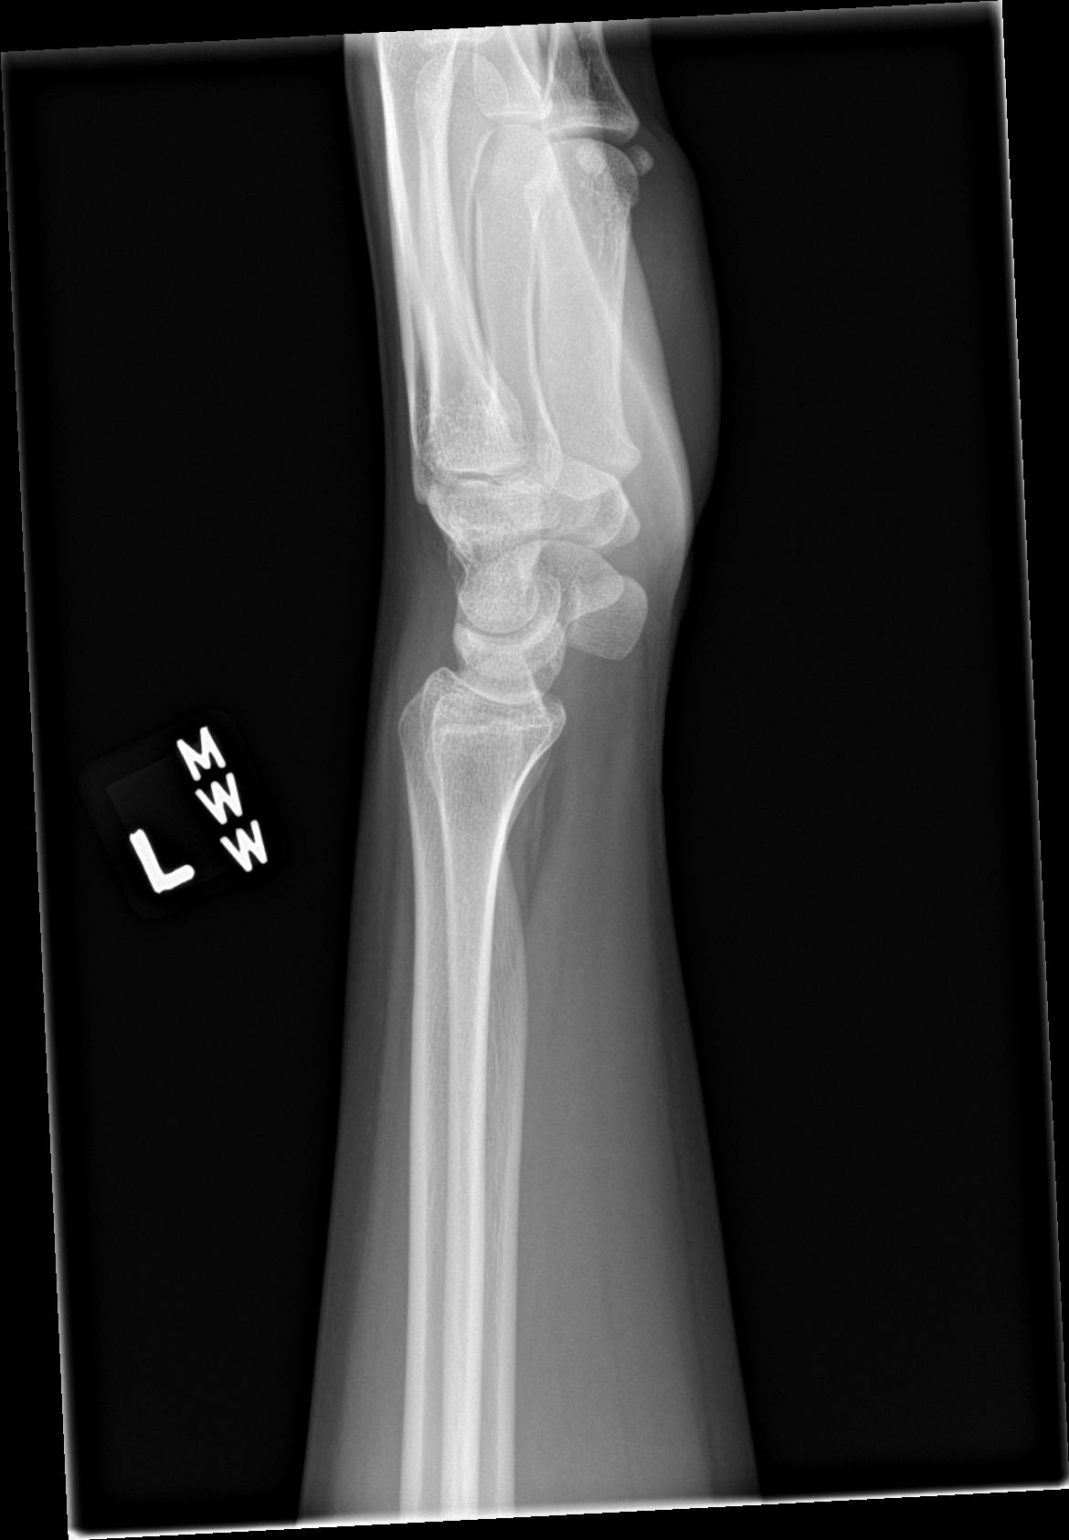

[3 of 3 positions shown; findings below may reference images not displayed]

FINDINGS: There is no evidence of fracture or dislocation. There is no
evidence of arthropathy or other focal bone abnormality. Soft
tissues are unremarkable.
IMPRESSION: Negative.

## 2023-02-27 IMAGING — DX DG SHOULDER 2+V*L*
3 series · 3 of 3 positions shown · non-contrast
Comparison: None.

CLINICAL DATA: Six months of left shoulder pain and numbness. No
known injury.

EXAM:
LEFT SHOULDER - 2+ VIEW

[shoulder ap]
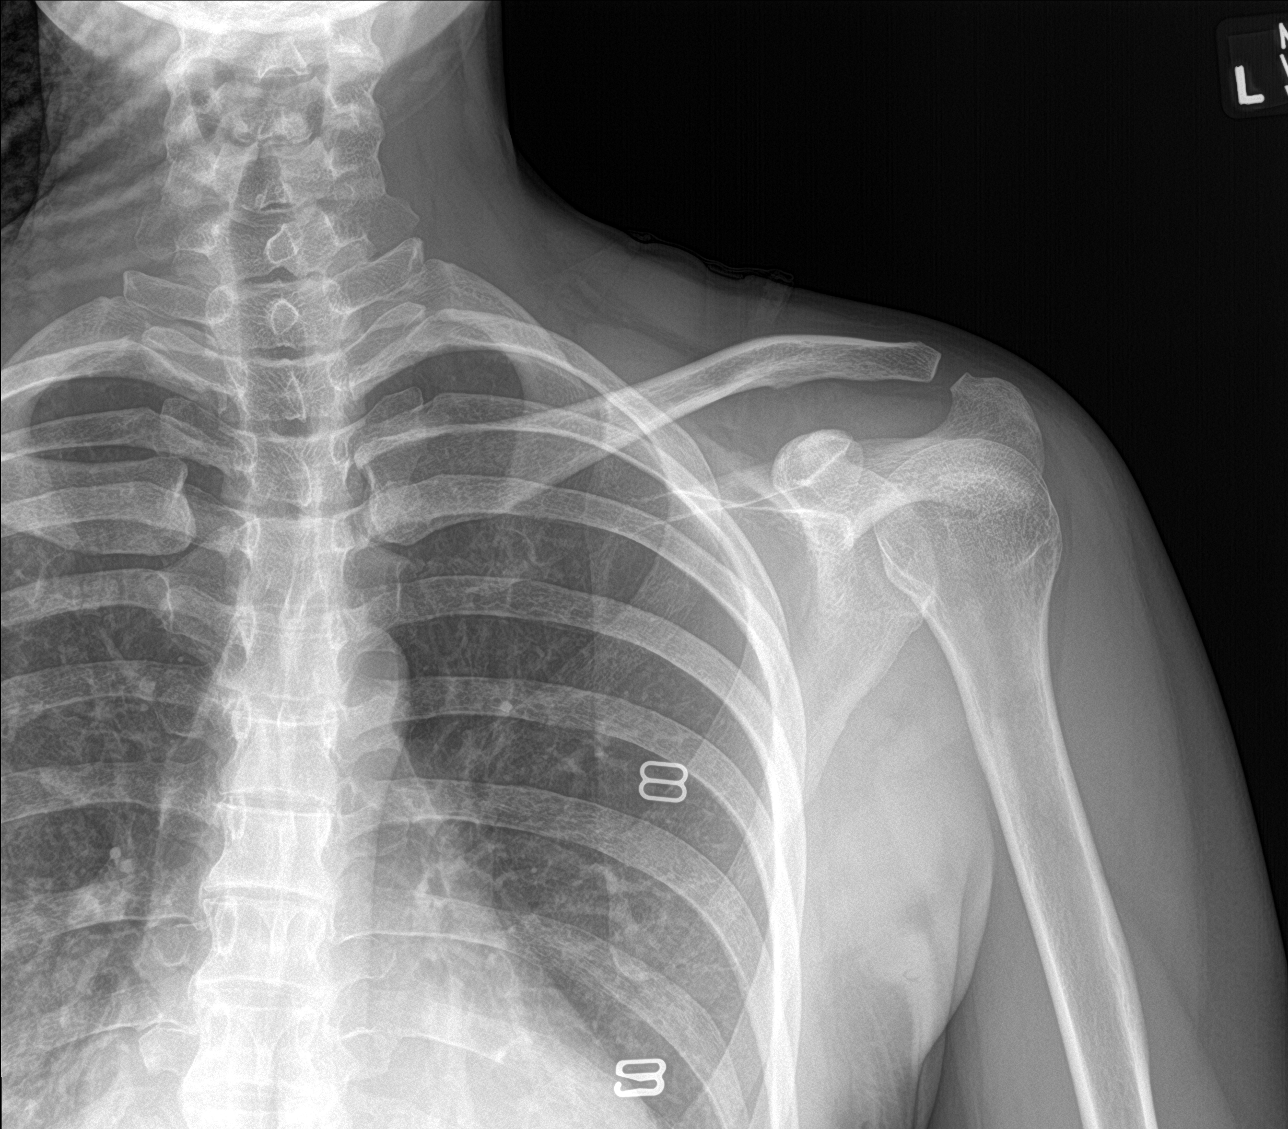

[shoulder grashey]
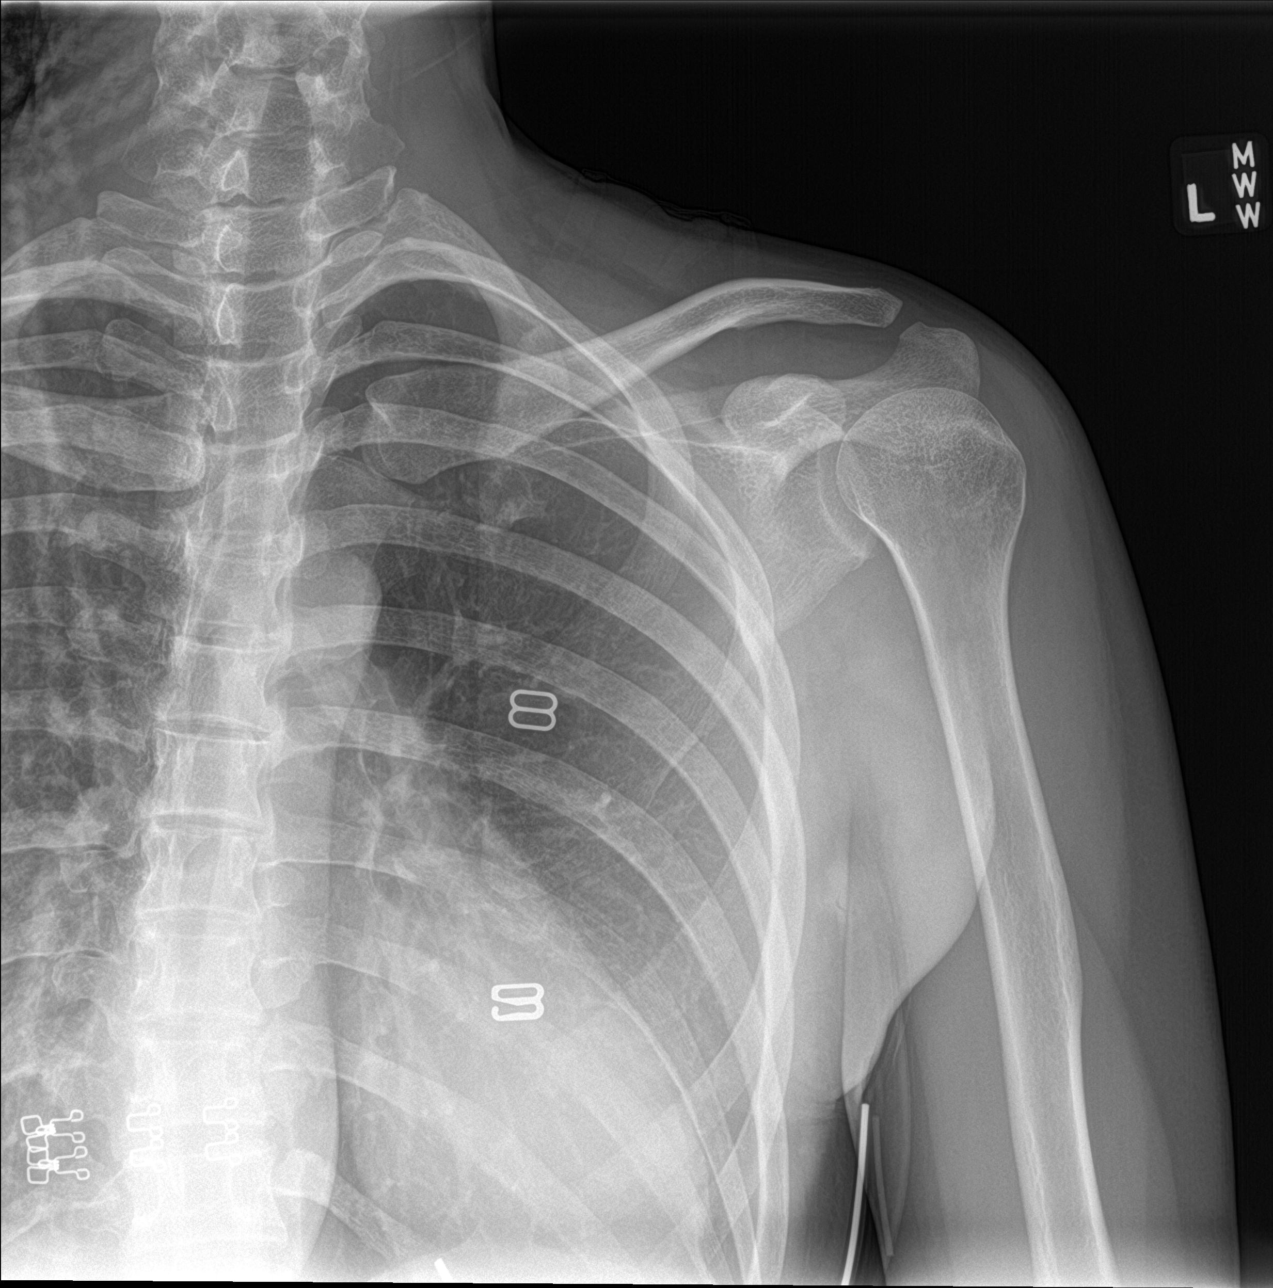

[shoulder y-view]
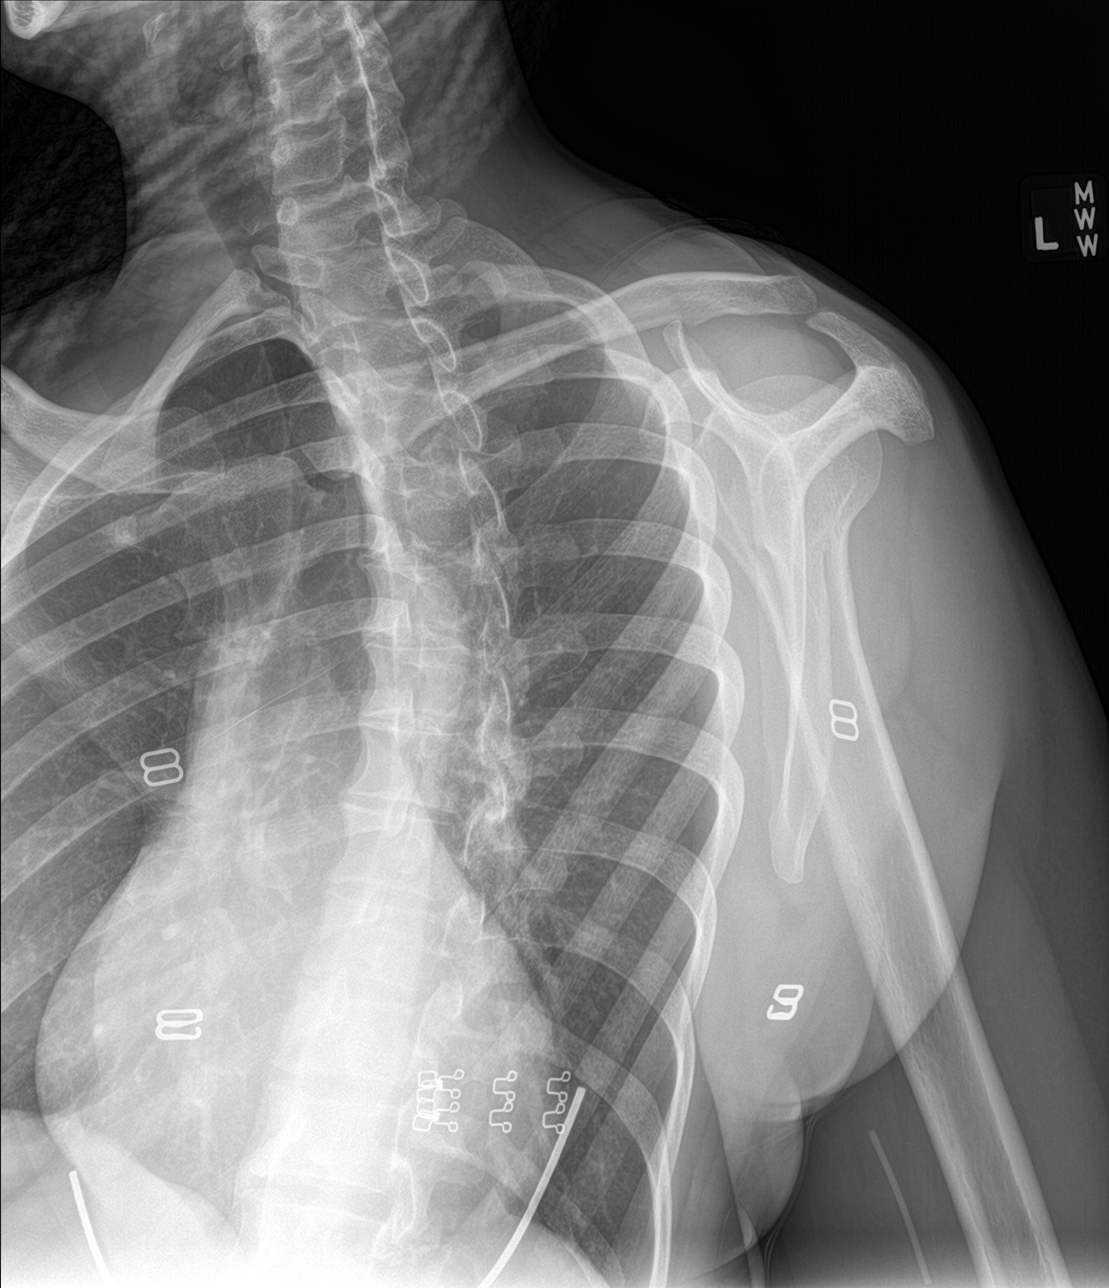

[3 of 3 positions shown; findings below may reference images not displayed]

FINDINGS: There is no evidence of fracture or dislocation. There is no
evidence of arthropathy or other focal bone abnormality. Soft
tissues are unremarkable. Visualized lung fields are clear.
IMPRESSION: Negative.

## 2023-03-02 ENCOUNTER — Telehealth: Payer: Self-pay

## 2023-03-02 NOTE — Telephone Encounter (Unsigned)
Copied from CRM [id number]. Topic: General - Call Back - No Documentation >> Mar 02, 2023  3:11 PM Leavy Cella D wrote: Reason for CRM: Patient called back to speak with Cassie regarding a message she receive . Please call patient back when available .

## 2023-03-02 NOTE — Telephone Encounter (Signed)
Copied from CRM [id number]. Topic: Appointments - Transfer of Care >> Mar 02, 2023 11:38 AM Samuel Jester B wrote: Pt is requesting to transfer FROM:  [employer] Pt is requesting to transfer TO: No preference just want office to be located in [place] or [place] Reason for requested transfer: Relocated and would like a second opinion  It is the responsibility of the team the patient would like to transfer to (Dr. N/A)  to reach out to the patient if for any reason this transfer is not acceptable.  Unfortunately, we do not have any [facility] offices located in [place] or [place]. Pt will need to find a primary care doctor on her own. Once she has a primary care provider she can fill out a medical record request and have it sent to our Records dept so that her records can be sent to her new provider. Attempted to call pt, no answer, VM was full. Will send a MyChart message.
